# Patient Record
Sex: Male | Born: 1935 | Hispanic: No | Smoking: Former smoker
Health system: Southern US, Community
[De-identification: ages and names within clinical notes are randomized; demographics above are authoritative.]

## PROBLEM LIST (undated history)

## (undated) DIAGNOSIS — R634 Abnormal weight loss: Secondary | ICD-10-CM

## (undated) DIAGNOSIS — I739 Peripheral vascular disease, unspecified: Secondary | ICD-10-CM

## (undated) DIAGNOSIS — E440 Moderate protein-calorie malnutrition: Secondary | ICD-10-CM

## (undated) DIAGNOSIS — I10 Essential (primary) hypertension: Secondary | ICD-10-CM

## (undated) DIAGNOSIS — I779 Disorder of arteries and arterioles, unspecified: Secondary | ICD-10-CM

## (undated) HISTORY — PX: CATARACT EXTRACTION: SUR2

## (undated) HISTORY — DX: Moderate protein-calorie malnutrition: E44.0

## (undated) HISTORY — PX: HEMORRHOID SURGERY: SHX153

## (undated) HISTORY — DX: Essential (primary) hypertension: I10

## (undated) HISTORY — DX: Peripheral vascular disease, unspecified: I73.9

## (undated) HISTORY — DX: Abnormal weight loss: R63.4

## (undated) HISTORY — DX: Disorder of arteries and arterioles, unspecified: I77.9

## (undated) HISTORY — PX: PROSTATE BIOPSY: SHX241

## (undated) HISTORY — PX: HERNIA REPAIR: SHX51

---

## 2000-06-27 ENCOUNTER — Other Ambulatory Visit: Admission: RE | Admit: 2000-06-27 | Discharge: 2000-06-27 | Payer: Self-pay | Admitting: Urology

## 2014-06-12 ENCOUNTER — Encounter (HOSPITAL_COMMUNITY): Payer: Self-pay | Admitting: *Deleted

## 2014-06-12 ENCOUNTER — Ambulatory Visit (INDEPENDENT_AMBULATORY_CARE_PROVIDER_SITE_OTHER): Payer: Medicare Other | Admitting: Neurology

## 2014-06-12 ENCOUNTER — Encounter: Payer: Self-pay | Admitting: Neurology

## 2014-06-12 ENCOUNTER — Encounter: Payer: Self-pay | Admitting: *Deleted

## 2014-06-12 ENCOUNTER — Inpatient Hospital Stay (HOSPITAL_COMMUNITY): Payer: Medicare Other

## 2014-06-12 ENCOUNTER — Inpatient Hospital Stay (HOSPITAL_COMMUNITY)
Admission: AD | Admit: 2014-06-12 | Discharge: 2014-06-18 | DRG: 056 | Payer: Medicare Other | Source: Ambulatory Visit | Attending: Internal Medicine | Admitting: Internal Medicine

## 2014-06-12 VITALS — BP 151/78 | HR 97 | Ht 69.0 in | Wt 131.0 lb

## 2014-06-12 DIAGNOSIS — R296 Repeated falls: Secondary | ICD-10-CM | POA: Diagnosis present

## 2014-06-12 DIAGNOSIS — G7000 Myasthenia gravis without (acute) exacerbation: Secondary | ICD-10-CM

## 2014-06-12 DIAGNOSIS — N400 Enlarged prostate without lower urinary tract symptoms: Secondary | ICD-10-CM | POA: Diagnosis present

## 2014-06-12 DIAGNOSIS — K5900 Constipation, unspecified: Secondary | ICD-10-CM | POA: Diagnosis present

## 2014-06-12 DIAGNOSIS — I10 Essential (primary) hypertension: Secondary | ICD-10-CM | POA: Diagnosis not present

## 2014-06-12 DIAGNOSIS — R1310 Dysphagia, unspecified: Secondary | ICD-10-CM | POA: Diagnosis present

## 2014-06-12 DIAGNOSIS — K219 Gastro-esophageal reflux disease without esophagitis: Secondary | ICD-10-CM | POA: Diagnosis present

## 2014-06-12 DIAGNOSIS — E43 Unspecified severe protein-calorie malnutrition: Secondary | ICD-10-CM | POA: Diagnosis present

## 2014-06-12 DIAGNOSIS — F329 Major depressive disorder, single episode, unspecified: Secondary | ICD-10-CM | POA: Diagnosis present

## 2014-06-12 DIAGNOSIS — Z87891 Personal history of nicotine dependence: Secondary | ICD-10-CM

## 2014-06-12 DIAGNOSIS — E860 Dehydration: Secondary | ICD-10-CM | POA: Diagnosis present

## 2014-06-12 DIAGNOSIS — Z820 Family history of epilepsy and other diseases of the nervous system: Secondary | ICD-10-CM | POA: Diagnosis not present

## 2014-06-12 DIAGNOSIS — H02433 Paralytic ptosis of bilateral eyelids: Secondary | ICD-10-CM | POA: Diagnosis present

## 2014-06-12 DIAGNOSIS — R29898 Other symptoms and signs involving the musculoskeletal system: Secondary | ICD-10-CM

## 2014-06-12 DIAGNOSIS — Z681 Body mass index (BMI) 19 or less, adult: Secondary | ICD-10-CM | POA: Diagnosis not present

## 2014-06-12 DIAGNOSIS — Z823 Family history of stroke: Secondary | ICD-10-CM | POA: Diagnosis not present

## 2014-06-12 DIAGNOSIS — R531 Weakness: Secondary | ICD-10-CM | POA: Diagnosis present

## 2014-06-12 DIAGNOSIS — Z8249 Family history of ischemic heart disease and other diseases of the circulatory system: Secondary | ICD-10-CM

## 2014-06-12 DIAGNOSIS — Z452 Encounter for adjustment and management of vascular access device: Secondary | ICD-10-CM | POA: Insufficient documentation

## 2014-06-12 DIAGNOSIS — T17908A Unspecified foreign body in respiratory tract, part unspecified causing other injury, initial encounter: Secondary | ICD-10-CM | POA: Insufficient documentation

## 2014-06-12 LAB — URINALYSIS, ROUTINE W REFLEX MICROSCOPIC
Glucose, UA: NEGATIVE mg/dL
HGB URINE DIPSTICK: NEGATIVE
Ketones, ur: 15 mg/dL — AB
Leukocytes, UA: NEGATIVE
NITRITE: NEGATIVE
Protein, ur: NEGATIVE mg/dL
Specific Gravity, Urine: 1.021 (ref 1.005–1.030)
Urobilinogen, UA: 0.2 mg/dL (ref 0.0–1.0)
pH: 5 (ref 5.0–8.0)

## 2014-06-12 LAB — COMPREHENSIVE METABOLIC PANEL
ALBUMIN: 2.5 g/dL — AB (ref 3.5–5.2)
ALK PHOS: 36 U/L — AB (ref 39–117)
ALT: 75 U/L — ABNORMAL HIGH (ref 0–53)
ANION GAP: 9 (ref 5–15)
AST: 113 U/L — AB (ref 0–37)
BILIRUBIN TOTAL: 0.7 mg/dL (ref 0.3–1.2)
BUN: 33 mg/dL — AB (ref 6–23)
CHLORIDE: 105 mmol/L (ref 96–112)
CO2: 25 mmol/L (ref 19–32)
Calcium: 9.8 mg/dL (ref 8.4–10.5)
Creatinine, Ser: 1.49 mg/dL — ABNORMAL HIGH (ref 0.50–1.35)
GFR calc Af Amer: 48 mL/min — ABNORMAL LOW (ref >90)
GFR calc non Af Amer: 42 mL/min — ABNORMAL LOW (ref >90)
Glucose, Bld: 93 mg/dL (ref 70–99)
Potassium: 4 mmol/L (ref 3.5–5.1)
Sodium: 139 mmol/L (ref 135–145)
Total Protein: 6.2 g/dL (ref 6.0–8.3)

## 2014-06-12 LAB — CBC
HEMATOCRIT: 37.6 % — AB (ref 39.0–52.0)
HEMOGLOBIN: 11.8 g/dL — AB (ref 13.0–17.0)
MCH: 29.4 pg (ref 26.0–34.0)
MCHC: 31.4 g/dL (ref 30.0–36.0)
MCV: 93.8 fL (ref 78.0–100.0)
Platelets: 141 10*3/uL — ABNORMAL LOW (ref 150–400)
RBC: 4.01 MIL/uL — AB (ref 4.22–5.81)
RDW: 16.1 % — ABNORMAL HIGH (ref 11.5–15.5)
WBC: 4.4 10*3/uL (ref 4.0–10.5)

## 2014-06-12 LAB — PROTIME-INR
INR: 1.17 (ref 0.00–1.49)
Prothrombin Time: 15 seconds (ref 11.6–15.2)

## 2014-06-12 LAB — TSH: TSH: 3.914 u[IU]/mL (ref 0.350–4.500)

## 2014-06-12 MED ORDER — ACETAMINOPHEN 325 MG PO TABS: 650.0000 mg | ORAL_TABLET | Freq: Four times a day (QID) | ORAL | Status: DC | PRN

## 2014-06-12 MED ORDER — ASPIRIN 325 MG PO TABS: 325.0000 mg | ORAL_TABLET | Freq: Every day | ORAL | Status: DC

## 2014-06-12 MED ORDER — ALUM & MAG HYDROXIDE-SIMETH 200-200-20 MG/5ML PO SUSP: 30.0000 mL | Freq: Four times a day (QID) | ORAL | Status: DC | PRN

## 2014-06-12 MED ORDER — OXYCODONE HCL 5 MG PO TABS
5.0000 mg | ORAL_TABLET | ORAL | Status: DC | PRN
  Administered 2014-06-14 – 2014-06-17 (×7): 5 mg via ORAL
  Filled 2014-06-12 (×7): qty 1

## 2014-06-12 MED ORDER — SODIUM CHLORIDE 0.9 % IV SOLN
INTRAVENOUS | Status: DC
  Administered 2014-06-12 – 2014-06-15 (×6): via INTRAVENOUS

## 2014-06-12 MED ORDER — PYRIDOSTIGMINE BROMIDE 60 MG PO TABS
60.0000 mg | ORAL_TABLET | Freq: Three times a day (TID) | ORAL | Status: DC
  Administered 2014-06-12 – 2014-06-18 (×18): 60 mg via ORAL
  Filled 2014-06-12 (×21): qty 1

## 2014-06-12 MED ORDER — HEPARIN SODIUM (PORCINE) 5000 UNIT/ML IJ SOLN
5000.0000 [IU] | Freq: Three times a day (TID) | INTRAMUSCULAR | Status: DC
  Administered 2014-06-12 – 2014-06-18 (×18): 5000 [IU] via SUBCUTANEOUS
  Filled 2014-06-12 (×20): qty 1

## 2014-06-12 MED ORDER — SODIUM CHLORIDE 0.9 % IV BOLUS (SEPSIS): 1000.0000 mL | Freq: Once | INTRAVENOUS | Status: DC

## 2014-06-12 MED ORDER — ONDANSETRON HCL 4 MG PO TABS: 4.0000 mg | ORAL_TABLET | Freq: Four times a day (QID) | ORAL | Status: DC | PRN

## 2014-06-12 MED ORDER — ENSURE COMPLETE PO LIQD
237.0000 mL | Freq: Two times a day (BID) | ORAL | Status: DC
  Administered 2014-06-12 – 2014-06-18 (×11): 237 mL via ORAL
  Filled 2014-06-12: qty 237

## 2014-06-12 MED ORDER — ACETAMINOPHEN 650 MG RE SUPP: 650.0000 mg | Freq: Four times a day (QID) | RECTAL | Status: DC | PRN

## 2014-06-12 MED ORDER — IMMUNE GLOBULIN (HUMAN) 5 GM/100ML IV SOLN
400.0000 mg/kg | INTRAVENOUS | Status: DC
  Administered 2014-06-12 – 2014-06-15 (×4): 25 g via INTRAVENOUS
  Filled 2014-06-12 (×7): qty 100

## 2014-06-12 MED ORDER — BENAZEPRIL HCL 20 MG PO TABS
20.0000 mg | ORAL_TABLET | Freq: Every day | ORAL | Status: DC
  Administered 2014-06-12 – 2014-06-18 (×7): 20 mg via ORAL
  Filled 2014-06-12 (×7): qty 1

## 2014-06-12 MED ORDER — SODIUM CHLORIDE 0.9 % IJ SOLN
3.0000 mL | Freq: Two times a day (BID) | INTRAMUSCULAR | Status: DC
  Administered 2014-06-15 – 2014-06-18 (×6): 3 mL via INTRAVENOUS

## 2014-06-12 MED ORDER — FINASTERIDE 5 MG PO TABS
5.0000 mg | ORAL_TABLET | Freq: Every day | ORAL | Status: DC
  Administered 2014-06-12 – 2014-06-18 (×7): 5 mg via ORAL
  Filled 2014-06-12 (×7): qty 1

## 2014-06-12 MED ORDER — SODIUM CHLORIDE 0.9 % IV BOLUS (SEPSIS): 500.0000 mL | Freq: Once | INTRAVENOUS | Status: DC

## 2014-06-12 MED ORDER — ONDANSETRON HCL 4 MG/2ML IJ SOLN
4.0000 mg | Freq: Four times a day (QID) | INTRAMUSCULAR | Status: DC | PRN
  Administered 2014-06-15: 4 mg via INTRAVENOUS
  Filled 2014-06-12: qty 2

## 2014-06-12 NOTE — Evaluation (Signed)
Physical Therapy Evaluation Patient Details Name: [name] MRN: [id number] DOB: [birth year] Today's Date: 06/12/2014   History of Present Illness  [name] is a 81 y.o. male with a past medical history of hypertension, gastroesophageal reflux disease, presenting as a transfer from his neurologist office. Patient having progressive generalized weakness that started 6 months to 1 year ago, that has been associated with diplopia, dysphasia, and bilateral ptosis. Patient states that symptoms occurring throughout the day however on occasion have been worse at nighttime. He reports having multiple falls in in the past with his last fall being one month ago. He also reports a 20 pound weight loss throughout this period, attributing this to minimal by mouth intake stating he cannot have solid foods. He was evaluated by his neurologist today felt that symptoms likely secondary to myasthenia gravis for which he was referred for further workup and initiation of therapy  Clinical Impression  Pt admitted with/for progressive weakness of months up to a year. Working dx thought to be due to Myasthenia Gravis..  Pt currently limited functionally due to the problems listed. ( See problems list.)   Pt will benefit from PT to maximize function and safety in order to get ready for next venue listed below.     Follow Up Recommendations CIR    Equipment Recommendations  Other (comment) (TBA)    Recommendations for Other Services Rehab consult     Precautions / Restrictions Precautions Precautions: Fall      Mobility  Bed Mobility Overal bed mobility: Needs Assistance Bed Mobility: Rolling;Sidelying to Sit Rolling: Min assist Sidelying to sit: Min assist       General bed mobility comments: truncal assist  Transfers Overall transfer level: Needs assistance   Transfers: Sit to/from Stand;Stand Pivot Transfers Sit to Stand: Min guard Stand pivot transfers: Min guard       General  transfer comment: cues for hand placement and safety.  Accomplished the transfer relativiely eacy with arm rest to push up on.  Ambulation/Gait Ambulation/Gait assistance: Min guard Ambulation Distance (Feet): 80 Feet Assistive device: Rolling walker (2 wheeled) Gait Pattern/deviations: Step-through pattern;Decreased step length - right;Decreased step length - left (decreased knee flexion at toe off.) Gait velocity: slower Gait velocity interpretation: Below normal speed for age/gender General Gait Details: mildly weak kneed with decr knee flexion at toe off.  Stairs            Wheelchair Mobility    Modified Rankin (Stroke Patients Only)       Balance Overall balance assessment: Needs assistance Sitting-balance support: No upper extremity supported Sitting balance-Leahy Scale: Fair     Standing balance support: No upper extremity supported Standing balance-Leahy Scale: Fair                               Pertinent Vitals/Pain Pain Assessment: Faces Faces Pain Scale: No hurt    Home Living Family/patient expects to be discharged to:: Private residence Living Arrangements: Alone Available Help at Discharge: Family;Available 24 hours/day Type of Home: House Home Access: Level entry     Home Layout: One level;Other (Comment) (but 1 room is raised/sunk in by 1 step level) Home Equipment: None      Prior Function Level of Independence: Independent (but used available furnitue and structures)               Hand Dominance        Extremity/Trunk Assessment  Upper Extremity Assessment: Generalized weakness;Overall [facility]FL for tasks assessed (grip and biceps/triceps weak)           Lower Extremity Assessment: Generalized weakness;RLE deficits/detail;LLE deficits/detail RLE Deficits / Details: hip flexors 3/5, quads 3+/5, hams 3/5, df/pr 4/5 LLE Deficits / Details: hip flexors 3/5, quads 3+/5, hams 3/5, df/pf 4/5,  Cervical / Trunk  Assessment:  (truncal weakness)  Communication   Communication: No difficulties  Cognition Arousal/Alertness: Awake/alert Behavior During Therapy: WFL for tasks assessed/performed Overall Cognitive Status: Within Functional Limits for tasks assessed                      General Comments      Exercises        Assessment/Plan    PT Assessment Patient needs continued PT services  PT Diagnosis Abnormality of gait;Generalized weakness   PT Problem List Decreased strength;Decreased activity tolerance;Decreased balance;Decreased mobility;Decreased knowledge of use of DME;Decreased safety awareness  PT Treatment Interventions Gait training;DME instruction;Stair training;Functional mobility training;Therapeutic activities;Therapeutic exercise;Neuromuscular re-education;Patient/family education;Balance training   PT Goals (Current goals can be found in the Care Plan section) Acute Rehab PT Goals Patient Stated Goal: Be safe independent at home. PT Goal Formulation: With patient Time For Goal Achievement: 06/26/14 Potential to Achieve Goals: Good    Frequency Min 3X/week   Barriers to discharge        Co-evaluation               End of Session   Activity Tolerance: Patient tolerated treatment well Patient left: in bed;with call bell/phone within reach Nurse Communication: Mobility status         Time: [id number] PT Time Calculation (min) (ACUTE ONLY): 31 min   Charges:   PT Evaluation $Initial PT Evaluation Tier I: 1 Procedure PT Treatments $Gait Training: 8-22 mins   PT G Codes:        Mckinnon Glick, Eliseo GumKenneth V 06/12/2014, 4:12 PM 06/12/2014  [place] BingKen Janira Mandell, PT [phone]  (pager)

## 2014-06-12 NOTE — H&P (Signed)
[facility] History and Physical  Ronald AprilBilly W Kuchera GEX:[id number] DOB: 02-20-1929 DOA: 06/12/2014  Referring physician:  PCP: Marylynn PearsonBurkhart,Brian, MD   Chief Complaint: Generalized Weakness  HPI: [name] is a 81 y.o. male with a past medical history of hypertension, gastroesophageal reflux disease, presenting as a transfer from his neurologist office. Patient having progressive generalized weakness that started 6 months to 1 year ago, that has been associated with diplopia, dysphasia, and bilateral ptosis. Patient states that symptoms occurring throughout the day however on occasion have been worse at nighttime. He reports having multiple falls in in the past with his last fall being one month ago. He also reports a 20 pound weight loss throughout this period, attributing this to minimal by mouth intake stating he cannot have solid foods. He was evaluated by his neurologist today felt that symptoms likely secondary to myasthenia gravis for which he was referred for further workup and initiation of therapy. Patient denies shortness of breath and it did not have evidence of airway compromise during my evaluation. At baseline he is highly functional, independent on all activities of daily living. Lately due to generalized weakness he has required increased assistance.                                                                                                                                                                                                                               Review of Systems:  Constitutional:  No weight loss, night sweats, Fevers, chills, positive for fatigue.  HEENT:  No headaches, Difficulty swallowing,Tooth/dental problems,Sore throat,  No sneezing, itching, ear ache, nasal congestion, post nasal drip,  Cardio-vascular:  No chest pain, Orthopnea, PND, swelling in lower extremities, anasarca, dizziness, palpitations  GI:  No heartburn, indigestion, abdominal  pain, nausea, vomiting, diarrhea, change in bowel habits, loss of appetite  Resp:  No shortness of breath with exertion or at rest. No excess mucus, no productive cough, No non-productive cough, No coughing up of blood.No change in color of mucus.No wheezing.No chest wall deformity  Skin:  no rash or lesions.  GU:  no dysuria, change in color of urine, no urgency or frequency. No flank pain.  Musculoskeletal:  Positive for generalized muscle weakness, diplopia, dysphasia  Psych:  No change in mood or affect. No depression or anxiety. No memory loss.   Past Medical History  Diagnosis Date  . Hypertension   . Carotid artery disease   . Weight  loss, unintentional   . Protein-calorie malnutrition, moderate    Past Surgical History  Procedure Laterality Date  . Hernia repair      Bilateral  . Prostate biopsy      WNL  . Hemorrhoid surgery    . Cataract extraction     Social History:  reports that he quit smoking about 44 years ago. He does not have any smokeless tobacco history on file. He reports that he does not drink alcohol or use illicit drugs.  Allergies  Allergen Reactions  . Sulfa Antibiotics Itching    Family History  Problem Relation Age of Onset  . Parkinson's disease Brother     Deceased - 62  . Heart disease Sister     Deceased - 44  . Heart attack Brother     Deceased - 59  . Stroke Brother   . Heart disease Mother     Prior to Admission medications   Medication Sig Start Date End Date Taking? Authorizing Provider  aspirin 325 MG tablet Take 325 mg by mouth daily.   Yes Historical Provider, MD  benazepril (LOTENSIN) 20 MG tablet Take 20 mg by mouth daily.   Yes Historical Provider, MD  finasteride (PROSCAR) 5 MG tablet Take 5 mg by mouth daily.   Yes Historical Provider, MD  mirtazapine (REMERON) 15 MG tablet Take 15 mg by mouth at bedtime.   Yes Historical Provider, MD  omeprazole (PRILOSEC) 20 MG capsule Take 20 mg by mouth 2 (two) times daily.   Yes  Historical Provider, MD   Physical Exam: Filed Vitals:   06/12/14 1223  BP: 131/71  Pulse: 81  Resp: 20  Height:  (1.753 m)  Weight: 59 kg (130 lb 1.1 oz)  SpO2: 98%    Wt Readings from Last 3 Encounters:  06/12/14 59 kg (130 lb 1.1 oz)  06/12/14 59.421 kg (131 lb)    General:  Ill-appearing, no acute distress. Has bilateral ptosis, weak speech, however is awake and alert. Eyes: PERRL, normal lids, irises & conjunctiva ENT: grossly normal hearing, lips & tongue Neck: no LAD, masses or thyromegaly Cardiovascular: RRR, no m/r/g. No LE edema. Telemetry: SR, no arrhythmias  Respiratory: CTA bilaterally, no w/r/r. Normal respiratory effort. Abdomen: soft, ntnd Skin: no rash or induration seen on limited exam Musculoskeletal: grossly normal tone BUE/BLE Psychiatric: grossly normal mood and affect, speech fluent and appropriate Neurologic: Patient having generalized weakness, weak speech, bilateral ptosis. Reports diplopia. Did not have focal neurological deficits on my exam.           Labs on Admission:  Basic Metabolic Panel: No results for input(s): NA, K, CL, CO2, GLUCOSE, BUN, CREATININE, CALCIUM, MG, PHOS in the last 168 hours. Liver Function Tests: No results for input(s): AST, ALT, ALKPHOS, BILITOT, PROT, ALBUMIN in the last 168 hours. No results for input(s): LIPASE, AMYLASE in the last 168 hours. No results for input(s): AMMONIA in the last 168 hours. CBC: No results for input(s): WBC, NEUTROABS, HGB, HCT, MCV, PLT in the last 168 hours. Cardiac Enzymes: No results for input(s): CKTOTAL, CKMB, CKMBINDEX, TROPONINI in the last 168 hours.  BNP (last 3 results) No results for input(s): BNP in the last 8760 hours.  ProBNP (last 3 results) No results for input(s): PROBNP in the last 8760 hours.  CBG: No results for input(s): GLUCAP in the last 168 hours.  Radiological Exams on Admission: No results found.  EKG: Independently reviewed.    Assessment/Plan Active Problems:   Myasthenia  gravis   Weakness of both legs   GERD (gastroesophageal reflux disease)   HTN (hypertension)   1. Probable myasthenia gravis. Patient presenting with clinical signs and symptoms highly suggestive of myasthenia gravis. He has had progressive weakness over the past 6 - 12 months including diplopia, ptosis, dysphasia. Furthermore he has had significant weight loss over the past several months secondary to poor by mouth intake. Case was discussed with neurology. Will obtain cholinesterase receptor antibodies and MRI of the brain. Neurology will likely start Mestinon along with IVIG. Will monitor closely for evidence of respiratory compromise. Consult speech pathology, meanwhile continue dysphagia 1 diet. Physical therapy and occupational therapy consultations to have also been placed. 2. Probable dehydration. Patient appears dehydrated on physical exam likely due to minimal by mouth intake from trouble swallowing secondary to myasthenia gravis. Will obtain a comprehensive metabolic panel meanwhile initiate IV fluid resuscitation starting with a 1000 mL bolus of NS followed by maintenance fluids at 75 mL/hour.  3. Essential hypertension. Blood pressure stable, patient having initial blood pressure 131/71. Will continue his home regimen with benazepril 20 mg by mouth daily. Will check a CMP now to assess kidney functon.  4. Severe protein calorie malnutrition. Secondary to poor PO intake from myasthenia. Will provide protein supplementation and treat underlying neurological cause.  5. GERD. Continue PPI therapy 6. DVT prophylaxis. [place] Heparin    Code Status: Full Family Communication: Spoke with his son and daughter at bedside Disposition Plan: Anticipate he will require greater than 2 nights hospitalization  Time spent: 65 min  Jeralyn Bennett [facility] Pager [phone]

## 2014-06-12 NOTE — Progress Notes (Signed)
Received prescreen request for inpatient rehab from PT and have reviewed pt's case. I noted that pt is performing functional mobility and gait at a min guard level. I would like to see how pt does with OT. I anticipate that pt may be too high level for the intensity of inpatient rehab and that pt will continue to make good progress.  I will check on pt's status tomorrow and make a recommendation then.  Thanks.  Juliann MuleJanine Giavanna Kang, PT Rehabilitation Admissions Coordinator [phone]

## 2014-06-12 NOTE — Progress Notes (Signed)
Pt not in room for VC/NIF. Will attempt later.

## 2014-06-12 NOTE — Consult Note (Signed)
NEURO HOSPITALIST CONSULT NOTE    Reason for Consult: Myasthenia Gravis  HPI:                                                                                                                                          [name] is an 81 y.o. male brought to hospital for further evaluation for myasthenia gravis. Patient was seen by Dr. Terrace Arabia (neurology) due to PCP referring to neurology for gradual onset generalized weakness (which started one year ago but has become significantly worse since November), intermittent binocular double vision--especially when looking in the distance, intermittent droopy eyelid, also with swallowing difficulty, weak cough, with symptoms worst is at evening time. Over the past few months, he has lost more than 20 pounds, gait difficulty, poor appetite, could no longer eat solid food, generalized weakness. He states he does not feel fatigued from chewing but has no appetite and just stops eating.  He states he has had no SOB but does feel winded when walking.  Patient was seen by Dr. Terrace Arabia today and she is concerned this represents a myasthenia exacerbation and needed hospitalization for Iv[place] and initiation of mestinon.   CT angiogram of the chest August 25th 2015, no pulmonary emboli, no mediastinal hematoma, or adenopathy, trace pericardial effusion, borderline bilateral healing for nose, no acute pulmonary edema, stable compression deformity of T8 vertebral body,  Arterial Doppler study of left upper extremity December 13 2013, normal left upper extremity arterial Doppler ultrasound. Left upper extremity venous Doppler ultrasound in August 2015, positive for left brachial vein DVT centered at the level of anterior cubital fossa.  Past Medical History  Diagnosis Date  . Hypertension   . Carotid artery disease   . Weight loss, unintentional   . Protein-calorie malnutrition, moderate     Past Surgical History  Procedure Laterality Date  . Hernia  repair      Bilateral  . Prostate biopsy      WNL  . Hemorrhoid surgery    . Cataract extraction      Family History  Problem Relation Age of Onset  . Parkinson's disease Brother     Deceased - 70  . Heart disease Sister     Deceased - 22  . Heart attack Brother     Deceased - 4  . Stroke Brother   . Heart disease Mother      Social History:  reports that he quit smoking about 44 years ago. He does not have any smokeless tobacco history on file. He reports that he does not drink alcohol or use illicit drugs.  Allergies  Allergen Reactions  . Sulfa Antibiotics Itching    MEDICATIONS:  Prior to Admission:  Prescriptions prior to admission  Medication Sig Dispense Refill Last Dose  . aspirin 325 MG tablet Take 325 mg by mouth daily.   06/11/2014 at 0800  . benazepril (LOTENSIN) 20 MG tablet Take 20 mg by mouth daily.   06/11/2014 at 0800  . finasteride (PROSCAR) 5 MG tablet Take 5 mg by mouth daily.   06/11/2014 at 0800  . mirtazapine (REMERON) 15 MG tablet Take 15 mg by mouth at bedtime.   06/11/2014 at hs  . omeprazole (PRILOSEC) 20 MG capsule Take 20 mg by mouth 2 (two) times daily.   06/11/2014 at 0800   Scheduled: . aspirin  325 mg Oral Daily  . benazepril  20 mg Oral Daily  . feeding supplement (ENSURE COMPLETE)  237 mL Oral BID BM  . finasteride  5 mg Oral Daily  . heparin  5,000 Units Subcutaneous 3 times per day  . Immune Globulin 5%  400 mg/kg Intravenous Q24 Hr x 5  . pyridostigmine  60 mg Oral 3 times per day  . sodium chloride  3 mL Intravenous Q12H     ROS:                                                                                                                                       History obtained from the patient  General ROS: negative for - chills, fatigue, fever, night sweats, weight gain or weight loss Psychological ROS: negative  for - behavioral disorder, hallucinations, memory difficulties, mood swings or suicidal ideation Ophthalmic ROS: negative for - blurry vision, double vision, eye pain or loss of vision ENT ROS: negative for - epistaxis, nasal discharge, oral lesions, sore throat, tinnitus or vertigo Allergy and Immunology ROS: negative for - hives or itchy/watery eyes Hematological and Lymphatic ROS: negative for - bleeding problems, bruising or swollen lymph nodes Endocrine ROS: negative for - galactorrhea, hair pattern changes, polydipsia/polyuria or temperature intolerance Respiratory ROS: negative for - cough, hemoptysis, shortness of breath or wheezing Cardiovascular ROS: negative for - chest pain, dyspnea on exertion, edema or irregular heartbeat Gastrointestinal ROS: negative for - abdominal pain, diarrhea, hematemesis, nausea/vomiting or stool incontinence Genito-Urinary ROS: negative for - dysuria, hematuria, incontinence or urinary frequency/urgency Musculoskeletal ROS: negative for - joint swelling or muscular weakness Neurological ROS: as noted in HPI Dermatological ROS: negative for rash and skin lesion changes   Blood pressure 131/71, pulse 81, resp. rate 20, height 5\' 9"  (1.753 m), weight 59 kg (130 lb 1.1 oz), SpO2 98 %.   Examination:  HEENT-  Normocephalic, no lesions, without obvious abnormality.  Normal external eye and conjunctiva.  Normal TM's bilaterally.  Normal auditory canals and external ears. Normal external nose, mucus membranes and septum.  Normal pharynx. Cardiovascular- S1, S2 normal, pulses palpable throughout   Lungs- chest clear, no wheezing, rales, normal symmetric air entry Abdomen- normal findings: bowel sounds normal Extremities- no edema Lymph-no adenopathy palpable Musculoskeletal-no joint tenderness, deformity or swelling Skin-warm and dry, no hyperpigmentation,  vitiligo, or suspicious lesions  Neurological Examination Mental Status: Alert, oriented, thought content appropriate.  Speech fluent but with prolonged talking he becomes mildly dysarthric without evidence of aphasia.  Able to follow 3 step commands without difficulty. Cranial Nerves: II: Discs flat bilaterally; Visual fields grossly normal, pupils equal, round, reactive to light and accommodation III,IV, VI: ptosis present in the left eye and worsens with prolonged vertical gaze, extra-ocular motions intact bilaterally V,VII: smile symmetric, facial light touch sensation normal bilaterally VIII: hearing normal bilaterally IX,X: gag reflex present XI: bilateral shoulder shrug XII: midline tongue extension --strong head extension strength with mild weakness noted with neck flexion.  No weakness noted in jaw muscles Motor: Right : Upper extremity   5/5    Left:     Upper extremity   5/5  Lower extremity   5/5     Lower extremity   5/5 --noted fatigability with rapid arm pumping maneuver from 5/5 to a 4-/5 strength.  Tone and bulk:normal tone throughout; no atrophy noted Sensory: Pinprick and light touch intact throughout, bilaterally Deep Tendon Reflexes: 2+ and symmetric throughout with no AJ Plantars: Right: downgoing   Left: downgoing Cerebellar: normal finger-to-nose, and normal heel-to-shin test Gait: not tested due to safety.       Lab Results: Basic Metabolic Panel: No results for input(s): NA, K, CL, CO2, GLUCOSE, BUN, CREATININE, CALCIUM, MG, PHOS in the last 168 hours.  Liver Function Tests: No results for input(s): AST, ALT, ALKPHOS, BILITOT, PROT, ALBUMIN in the last 168 hours. No results for input(s): LIPASE, AMYLASE in the last 168 hours. No results for input(s): AMMONIA in the last 168 hours.  CBC: No results for input(s): WBC, NEUTROABS, HGB, HCT, MCV, PLT in the last 168 hours.  Cardiac Enzymes: No results for input(s): CKTOTAL, CKMB, CKMBINDEX, TROPONINI in  the last 168 hours.  Lipid Panel: No results for input(s): CHOL, TRIG, HDL, CHOLHDL, VLDL, LDLCALC in the last 168 hours.  CBG: No results for input(s): GLUCAP in the last 168 hours.  Microbiology: No results found for this or any previous visit.  Coagulation Studies: No results for input(s): LABPROT, INR in the last 72 hours.  Imaging: No results found.   Assessment and plan per attending neurologist  Felicie Morn PA-C [facility] [phone]  06/12/2014, 1:03 PM   Assessment/Plan: 81 YO male presenting with 1 year history of increased fatigue and decreased appetite now with worsening fatigue, intermittent diplopia, intermittent ptosis and swallowing difficulty which is worse in the evenings. Concern is for generalized myasthenia gravis.    Recommend: 1) MRI brain  2) Acetylcholine antibodies and myasthenia gravis panel 3) IVIG daily for total 5 doses 4) Daily NIF and VC 5) SLP to evaluate for swallowing and diet 6) Mestinon 60 mg TID   I personally participated in this patient's evaluation and management, including formulating the above clinical impression and management recommendations.  Venetia Maxon M.D. [facility] [phone]

## 2014-06-12 NOTE — Progress Notes (Signed)
PATIENT: [name] DOB: 07/29/1928  HISTORICAL  [name] is a 81 yo RH is referred by his primary care physician Dr. Elton SinBurkhart for evaluation of weakness he is accompanied by his daughter, and son at today's clinical visit.  He had a history of hypertension, highly functional, lives alone, independent until spring of 2015, he began to notice gradual onset generalized weakness, intermittent binocular double vision, intermittent droopy eyelid, also with swallowing difficulty, weak cough, his symptoms varies, worst is at evening time,  He denies sensory loss, no significant pain, no bowel and bladder incontinence.  Over the past few months, he has lost more than 20 pounds, gait difficulty, poor appetite, could no longer eat solid food, generalized weakness.  CT angiogram of the chest August 25th 2015, no pulmonary emboli, no mediastinal hematoma, or adenopathy, trace pericardial effusion, borderline bilateral healing for nose, no acute pulmonary edema, stable compression deformity of T8 vertebral body,  Arterial Doppler study of left upper extremity December 13 2013, normal left upper extremity arterial Doppler ultrasound. Left upper extremity venous Doppler ultrasound in August 2015, positive for left brachial vein DVT centered at the level of anterior cubital fossa.  Laboratory August 2015, protein C was low 61, normal protein C function, mild low antithrombin activity 69, normal CBC, BMP, liver function tests, mild elevated LDL 133  REVIEW OF SYSTEMS: Full 14 system review of systems performed and notable only for weight loss, fatigue, double vision, easy bruising, weakness, difficulty swallowing, depression, not enough sleep, change in appetite, trouble swallowing.   ALLERGIES: Not on File  HOME MEDICATIONS: Current Outpatient Prescriptions  Medication Sig Dispense Refill  . aspirin 325 MG tablet Take 325 mg by mouth daily.    . benazepril (LOTENSIN) 20 MG tablet Take 20  mg by mouth daily.    . finasteride (PROSCAR) 5 MG tablet Take 5 mg by mouth daily.    . mirtazapine (REMERON) 15 MG tablet Take 15 mg by mouth at bedtime.    Marland Kitchen. omeprazole (PRILOSEC) 20 MG capsule Take 20 mg by mouth 2 (two) times daily.     No current facility-administered medications for this visit.    PAST MEDICAL HISTORY: Past Medical History  Diagnosis Date  . Hypertension   . Carotid artery disease   . Weight loss, unintentional   . Protein-calorie malnutrition, moderate     PAST SURGICAL HISTORY: Past Surgical History  Procedure Laterality Date  . Hernia repair      Bilateral  . Prostate biopsy      WNL  . Hemorrhoid surgery    . Cataract extraction      FAMILY HISTORY: Family History  Problem Relation Age of Onset  . Parkinson's disease Brother     Deceased - 2774  . Heart disease Sister     Deceased - 7682  . Heart attack Brother     Deceased - 7478  . Stroke Brother   . Heart disease Mother     SOCIAL HISTORY:  History   Social History  . Marital Status: Unknown    Spouse Name: N/A  . Number of Children: N/A  . Years of Education: N/A   Occupational History  . Not on file.   Social History Main Topics  . Smoking status: Former Games developermoker  . Smokeless tobacco: Not on file  . Alcohol Use: No  . Drug Use: No  . Sexual Activity: Not on file   Other Topics Concern  . Not on file  Social History Narrative   PHYSICAL EXAM   Filed Vitals:   06/12/14 0905  BP: 151/78  Pulse: 97  Height:  (1.753 m)  Weight: 131 lb (59.421 kg)    Not recorded      Body mass index is 19.34 kg/(m^2).   Generalized: In no acute distress  Neck: Supple, no carotid bruits   Cardiac: Regular rate rhythm  Pulmonary: Clear to auscultation bilaterally  Musculoskeletal: No deformity  Neurological examination  Mentation: fragile elderly gentleman, weak cough, mild slurred speech,  alert oriented to time, place, history taking, and causual  conversation  Cranial nerve II-XII: Pupils were equal round reactive to light. Extraocular movements were full. Red lens testing confirmed bilateral lateral rectus weakness.  Visual field were full on confrontational test. Bilateral fundi were sharp.  Facial sensation was normal. Fatigable bilateral ptosis, left worse than right, moderate eye closure weakness. Marland Kitchen Hearing was intact to finger rubbing bilaterally. Uvula tongue midline. Moderate neck flexion weakness, mild to moderate soft palate elevation and tongue protrusion weakness.  Motor: Moderate neck flexion weakness, moderate bilateral shoulder abduction, external rotation, bilateral hip flexion weakness. Moderate bilateral ankle dorsiflexion weakness, right worse than left.   Sensory: Intact to fine touch, pinprick, preserved vibratory sensation at the ankle   Coordination: Normal finger to nose, heel-to-shin bilaterally there was no truncal ataxia  Gait: Rising up from seated position by pushing on chair arm, cautious, unsteady, bilateral foot drop, right worse than left   Romberg signs: Negative  Deep tendon reflexes: Brachioradialis 2/2, biceps 2/2, triceps 2/2, patellar 2/2, Achilles trace, plantar responses were flexor bilaterally.   DIAGNOSTIC DATA (LABS, IMAGING, TESTING) - I reviewed patient records, labs, notes, testing and imaging myself where available.   ASSESSMENT AND PLAN  [name] is a 81 y.o. male  presenting with fatigable bilateral ptosis, binocular double vision, proximal weakness, swallowing difficulty  1. Most consistent with generalized myasthenia gravis,  He needs MRI of the brain without contrast, acetylcholine receptor antibody, to confirm the diagnosis. IV hydration, speech evaluations, respiratory therapist evaluation for baseline.  I have discussed with   patient, and his family, he had rapid weight loss, difficulty swallowing, weak cough, mild breathing difficulty, he might experience worsening  difficulties during initial treatment, especially with high dose of steroid, increased secretion with Mestinon treatment.  Patient's and family decided to proceed with hospital admission, he will benefit high dose of steroid, plasma exchange versus IVIG, Mestinon 60 mg 3 times a day,    I have talked with neuro hospitalist Dr. Roseanne Reno, and [facility] hospitalist, for direct admission.   Will  continue to follow up  at clinic after discharge  Levert Feinstein, M.D. Ph.D.  [facility] [address], [address] [place], [place] [address] Ph: [phone] Fax: [phone]

## 2014-06-12 NOTE — H&P (Signed)
Nif and VC performed at this time per MD order. Pt obtained VC of 2.0L which was best of 3 and Nif of >-60cm H20 best of 3. Pt tolerated well and had good effort.

## 2014-06-12 NOTE — Progress Notes (Signed)
INITIAL NUTRITION ASSESSMENT  DOCUMENTATION CODES Per approved criteria  -Severe malnutrition in the context of chronic illness   Pt meets criteria for severe MALNUTRITION in the context of chronic illness as evidenced by 19.8% wt loss x 6 months, severe fat and muscle depletion.  INTERVENTION: -Continue with Ensure Complete po BID, each supplement provides 350 kcal and 13 grams of protein -RD to follow for diet recommendations based on SLP evaluation  NUTRITION DIAGNOSIS: Malnutrition related to decreased oral intake, dysphagia as evidenced by diet hx, moderate to severe fat and muscle depletion.   Goal: Pt will meet >90% of estimated nutritional needs  Monitor:  PO/supplement intake, labs, weight changes, I/O's  Reason for Assessment: MST=4  81 y.o. male  Admitting Dx: <principal problem not specified>  [name] is a 81 y.o. male presenting with fatigable bilateral ptosis, binocular double vision, proximal weakness, swallowing difficulty.   ASSESSMENT: Pt reports an overall decline in health over the past 6 months, including decreased mobility, weight loss, and difficulty swallowing.  He reports that when he swallows foods "hang" in his throat and won't go down. He reports that his diet PTA is very limited, consisting mainly of oatmeal and Ensure supplements.  He reports UBW of 162#, which he last weighed approximately 6 months ago (19.8% wt loss, which is significant). He also reports that he has "no muscle".  Spoke with RN who reports pt is awaiting swallow evaluation and tray is being held until pt is seen by speech to ensure safety while eating.  No labs available for review at this time.   Nutrition Focused Physical Exam:  Subcutaneous Fat:  Orbital Region: moderate depletion Upper Arm Region: severe depletion Thoracic and Lumbar Region: severe depletion  Muscle:  Temple Region: moderate depletion Clavicle Bone Region: severe depletion Clavicle and  Acromion Bone Region: severe depletion Scapular Bone Region: severe depletion Dorsal Hand: moderate depletion Patellar Region: severe depletion Anterior Thigh Region: mild depletion Posterior Calf Region: mild depletion  Edema: none present  Height: Ht Readings from Last 1 Encounters:  06/12/14 5\' 9"  (1.753 m)    Weight: Wt Readings from Last 1 Encounters:  06/12/14 130 lb 1.1 oz (59 kg)    Ideal Body Weight: 160#  % Ideal Body Weight: 81%  Wt Readings from Last 10 Encounters:  06/12/14 130 lb 1.1 oz (59 kg)  06/12/14 131 lb (59.421 kg)    Usual Body Weight: 162#  % Usual Body Weight: 80%  BMI:  Body mass index is 19.2 kg/(m^2). Normal weight range  Estimated Nutritional Needs: Kcal: 1750-1950 Protein: 71-81 grams Fluid: 1.7-1.9 L  Skin: No issues noted  Diet Order: DIET - DYS 1  EDUCATION NEEDS: -Education needs addressed  No intake or output data in the 24 hours ending 06/12/14 1313  Last BM: PTA  Labs:  No results for input(s): NA, K, CL, CO2, BUN, CREATININE, CALCIUM, MG, PHOS, GLUCOSE in the last 168 hours.  CBG (last 3)  No results for input(s): GLUCAP in the last 72 hours.  Scheduled Meds: . benazepril  20 mg Oral Daily  . feeding supplement (ENSURE COMPLETE)  237 mL Oral BID BM  . finasteride  5 mg Oral Daily  . heparin  5,000 Units Subcutaneous 3 times per day  . Immune Globulin 5%  400 mg/kg Intravenous Q24 Hr x 5  . pyridostigmine  60 mg Oral 3 times per day  . sodium chloride  3 mL Intravenous Q12H    Continuous Infusions: . sodium  chloride      Past Medical History  Diagnosis Date  . Hypertension   . Carotid artery disease   . Weight loss, unintentional   . Protein-calorie malnutrition, moderate     Past Surgical History  Procedure Laterality Date  . Hernia repair      Bilateral  . Prostate biopsy      WNL  . Hemorrhoid surgery    . Cataract extraction      Sarabi Sockwell A. Mayford Knife, RD, LDN, CDE Pager: [phone] After  hours Pager: [phone]

## 2014-06-13 ENCOUNTER — Inpatient Hospital Stay (HOSPITAL_COMMUNITY): Payer: Medicare Other

## 2014-06-13 DIAGNOSIS — T17998D Other foreign object in respiratory tract, part unspecified causing other injury, subsequent encounter: Secondary | ICD-10-CM

## 2014-06-13 DIAGNOSIS — T17908A Unspecified foreign body in respiratory tract, part unspecified causing other injury, initial encounter: Secondary | ICD-10-CM | POA: Insufficient documentation

## 2014-06-13 DIAGNOSIS — R29898 Other symptoms and signs involving the musculoskeletal system: Secondary | ICD-10-CM

## 2014-06-13 DIAGNOSIS — I10 Essential (primary) hypertension: Secondary | ICD-10-CM

## 2014-06-13 DIAGNOSIS — K219 Gastro-esophageal reflux disease without esophagitis: Secondary | ICD-10-CM

## 2014-06-13 DIAGNOSIS — E43 Unspecified severe protein-calorie malnutrition: Secondary | ICD-10-CM

## 2014-06-13 DIAGNOSIS — G7000 Myasthenia gravis without (acute) exacerbation: Principal | ICD-10-CM

## 2014-06-13 LAB — BASIC METABOLIC PANEL
Anion gap: 10 (ref 5–15)
BUN: 29 mg/dL — ABNORMAL HIGH (ref 6–23)
CO2: 22 mmol/L (ref 19–32)
Calcium: 9.5 mg/dL (ref 8.4–10.5)
Chloride: 107 mmol/L (ref 96–112)
Creatinine, Ser: 1.37 mg/dL — ABNORMAL HIGH (ref 0.50–1.35)
GFR calc Af Amer: 53 mL/min — ABNORMAL LOW (ref >90)
GFR, EST NON AFRICAN AMERICAN: 46 mL/min — AB (ref >90)
GLUCOSE: 132 mg/dL — AB (ref 70–99)
POTASSIUM: 4 mmol/L (ref 3.5–5.1)
Sodium: 139 mmol/L (ref 135–145)

## 2014-06-13 LAB — CBC
HCT: 39.2 % (ref 39.0–52.0)
Hemoglobin: 12.4 g/dL — ABNORMAL LOW (ref 13.0–17.0)
MCH: 29.5 pg (ref 26.0–34.0)
MCHC: 31.6 g/dL (ref 30.0–36.0)
MCV: 93.3 fL (ref 78.0–100.0)
PLATELETS: 154 10*3/uL (ref 150–400)
RBC: 4.2 MIL/uL — ABNORMAL LOW (ref 4.22–5.81)
RDW: 16.2 % — AB (ref 11.5–15.5)
WBC: 4.3 10*3/uL (ref 4.0–10.5)

## 2014-06-13 LAB — STRIATED MUSCLE ANTIBODY: Anti-striation Abs: NEGATIVE

## 2014-06-13 LAB — ACETYLCHOLINE RECEPTOR, BINDING: Acety choline binding ab: 0.04 nmol/L (ref 0.00–0.24)

## 2014-06-13 MED ORDER — WHITE PETROLATUM GEL
Status: AC
  Administered 2014-06-13: 1
  Filled 2014-06-13: qty 1

## 2014-06-13 NOTE — Progress Notes (Signed)
Subjective: Patient had no new complaints. He still experiencing some difficulty with swallowing and eyelid ptosis as well as intermittent diplopia.  Objective: Current vital signs: BP 140/75 mmHg  Pulse 84  Temp(Src) 98 F (36.7 C) (Oral)  Resp 20  Ht 5\' 9"  (1.753 m)  Wt 59 kg (130 lb 1.1 oz)  BMI 19.20 kg/m2  SpO2 99%  Neurologic Exam: He was alert and in no acute distress. Mental status was normal. Extraocular movements were full and conjugate. Mild right lid ptosis and moderate left lid ptosis noted. He had minimal weakness of the orbicularis oris oculi and orbicularis oris muscles. Neck flexor strength was 4-/5; extensors were normal. Upper extremity strength was normal except for 4/5 strength of deltoid muscle bilaterally. Lower extremities were normal except for 4/5 strength of hip flexors bilaterally.  Medications: I have reviewed the patient's current medications.  Assessment/Plan: 81 year old man admitted for management of exacerbation of myasthenia gravis. No clear precipitating factor has been identified.  Plan: 1. Continue IVIG daily for total of 5 treatments. However, if patient is not showing significant improvement after third treatment, plasmapheresis will be considered. 2. Continue Mestinon at current dose. 3. Continue physical therapy, occupational therapy and SLP intervention.  C.R. Roseanne RenoStewart, MD [facility] [phone]  06/13/2014  8:38 AM

## 2014-06-13 NOTE — Progress Notes (Signed)
Physical Therapy Treatment Patient Details Name: [name] MRN: [id number] DOB: 1929-02-17 Today's Date: 06/13/2014    History of Present Illness [name] is a 81 y.o. male with a past medical history of hypertension, gastroesophageal reflux disease, presenting as a transfer from his neurologist office. Patient having progressive generalized weakness that started 6 months to 1 year ago, that has been associated with diplopia, dysphasia, and bilateral ptosis. Patient states that symptoms occurring throughout the day however on occasion have been worse at nighttime. He reports having multiple falls in in the past with his last fall being one month ago. He also reports a 20 pound weight loss throughout this period, attributing this to minimal by mouth intake stating he cannot have solid foods. He was evaluated by his neurologist today felt that symptoms likely secondary to myasthenia gravis for which he was referred for further workup and initiation of therapy    PT Comments    Pt with overall generalized weakness and fatigue.  Pt had just come back from an AM test and had not eaten breakfast.  After ambulation of 100' with RW and min/guard, pt set up in chair with breakfast.    Follow Up Recommendations  CIR     Equipment Recommendations  Other (comment) (TBD)    Recommendations for Other Services       Precautions / Restrictions Precautions Precautions: Fall    Mobility  Bed Mobility               General bed mobility comments: up in recliner upon arrival  Transfers Overall transfer level: Needs assistance Equipment used: Rolling walker (2 wheeled) Transfers: Sit to/from Stand Sit to Stand: Min guard         General transfer comment: cues to scoot to edge of chair first before standing.  Ambulation/Gait Ambulation/Gait assistance: Min guard Ambulation Distance (Feet): 100 Feet Assistive device: Rolling walker (2 wheeled) Gait Pattern/deviations:  Step-through pattern;Decreased step length - right;Decreased step length - left Gait velocity: slower   General Gait Details: General weakness and fatigue noted with gait.  Pt subjectively reporting he just feels "so weak".  Some difficulty maneuvering the RW in tight spaces in the room and needed cues to keep RW with him.   Stairs            Wheelchair Mobility    Modified Rankin (Stroke Patients Only)       Balance             Standing balance-Leahy Scale: Fair                      Cognition Arousal/Alertness: Awake/alert Behavior During Therapy: WFL for tasks assessed/performed Overall Cognitive Status: Within Functional Limits for tasks assessed                      Exercises      General Comments General comments (skin integrity, edema, etc.): Pt's breakfast had arrived while ambulating and pt was ready to eat.  Verbally reviewed therex he could do in sitting and in supine      Pertinent Vitals/Pain Pain Assessment: No/denies pain    Home Living                      Prior Function            PT Goals (current goals can now be found in the care plan section) Acute Rehab PT Goals Patient  Stated Goal: Be safe independent at home. PT Goal Formulation: With patient Time For Goal Achievement: 06/26/14 Potential to Achieve Goals: Good Progress towards PT goals: Progressing toward goals    Frequency  Min 3X/week    PT Plan Current plan remains appropriate    Co-evaluation             End of Session   Activity Tolerance: Patient limited by fatigue;Patient tolerated treatment well Patient left: in chair;with chair alarm set;with call bell/phone within reach     Time: 0900-0911 PT Time Calculation (min) (ACUTE ONLY): 11 min  Charges:  $Gait Training: 8-22 mins                    G Codes:      Genavieve Mangiapane LUBECK 06/13/2014, 9:30 AM

## 2014-06-13 NOTE — Progress Notes (Signed)
NIF >-60, VC 2.0 good effort

## 2014-06-13 NOTE — Evaluation (Signed)
Occupational Therapy Evaluation Patient Details Name: [name] MRN: [id number] DOB: 1928-06-03 Today's Date: 06/13/2014    History of Present Illness Pt is an 81 y.o. Male admitted 06/12/14 from his neurologist office due to progressive generalized weakness and associated diplopia, dysphagia, ptosis. Pt has had multiple falls at home. Neurologist admitted pt due to symptoms likely 2/2 myasthenia gravis. PMH: HTN, GERD. Pt now being treated for myasthenia gravis on IVIG and Mestinon. MRI brain showed no acute findings. MBS by SLP showed dysphagia.    Clinical Impression   PTA pt lived at home and was independent with ADLs, although he admits that he was needing increased assistance due to generalized and progressive weakness. Pt presents with deconditioning, generalized weakness, and decreased activity tolerance. Pt demonstrates unsafe use of RW and has multiple instances of LOB and staggering during realistic home environment situations, including picking up the RW to maneuver around obstacles or in tight spaces, further increased his fall risk. Pt has a hx of falls at home and feel that he is not safe, however he would be an excellent candidate for intensive therapy to return to PLOF and enable return home with family support. Pt's family lives across the street and are very supportive.     Follow Up Recommendations  CIR;Supervision/Assistance - 24 hour    Equipment Recommendations  Other (comment) (TBD)    Recommendations for Other Services       Precautions / Restrictions Precautions Precautions: Fall Precaution Comments: pt/family report multiple falls at home Restrictions Weight Bearing Restrictions: No      Mobility Bed Mobility Overal bed mobility: Needs Assistance Bed Mobility: Supine to Sit;Sit to Supine     Supine to sit: Min assist Sit to supine: Min assist   General bed mobility comments: Pt required heavy min assist with hand held assist to pull to sitting  at EOB. Pt able to use bed rail to assist. VC's for sequencing to get pt to scoot to EOB. Pt required assistance to return LEs to bed when returning supine.   Transfers Overall transfer level: Needs assistance Equipment used: Rolling walker (2 wheeled) Transfers: Sit to/from Stand Sit to Stand: Min assist         General transfer comment: Min A to stand. Pt demonstrates unsafe technique and pulls on RW. Multimodal cues for hand placement and min A to power up to standing.     Balance Overall balance assessment: Needs assistance Sitting-balance support: No upper extremity supported Sitting balance-Leahy Scale: Fair     Standing balance support: Bilateral upper extremity supported;During functional activity Standing balance-Leahy Scale: Poor Standing balance comment: Pt requires UE support for dynamic balance. Pt with LOB when reaching for items at sink.                             ADL Overall ADL's : Needs assistance/impaired Eating/Feeding: Supervision/ safety;Sitting (90*, with full supervision per SLP)   Grooming: Minimal assistance;Standing;Wash/dry hands Grooming Details (indicate cue type and reason): pt needed VC's to locate items needed. Min A to stand.  Upper Body Bathing: Set up;Sitting   Lower Body Bathing: Moderate assistance;Sit to/from stand Lower Body Bathing Details (indicate cue type and reason): Assist to reach Bil LEs and min A to stand Upper Body Dressing : Minimal assistance;Sitting Upper Body Dressing Details (indicate cue type and reason): due to truncal weakness Lower Body Dressing: Maximal assistance;Sit to/from stand   Toilet Transfer: Minimal assistance;Ambulation;RW;Comfort height toilet;Grab  bars Toilet Transfer Details (indicate cue type and reason): VC's for hand placement. Min A to power up due to LE weakness. Pt demonstrated unsafe technique qith  Toileting- Clothing Manipulation and Hygiene: Minimal assistance;Sitting/lateral  lean Toileting - Clothing Manipulation Details (indicate cue type and reason): Pt performed toilet hygiene in sitting and required min A to stand       General ADL Comments: Pt presents with weakness and decreased endurance. Pt requires assistance to get OOB and when ambulating, pt required min A to maintain balance due to instances of stumbling. Without VC's pt picked up RW to maneuver around obstacles and feel that pt would be a high fall risk if returning home by himself. Pt required min A to power up from bed/toilet due to LE weakness.      Vision Additional Comments: pt reports diplopia at times (not currently). Noted ptosis. Pt required cues to locate items for grooming when standing at sink          Pertinent Vitals/Pain Pain Assessment: No/denies pain     Hand Dominance Right   Extremity/Trunk Assessment Upper Extremity Assessment Upper Extremity Assessment: Generalized weakness (generally 3/5 in Bil UEs)   Lower Extremity Assessment Lower Extremity Assessment: Defer to PT evaluation   Cervical / Trunk Assessment Cervical / Trunk Assessment:  (truncal weakness)   Communication Communication Communication: No difficulties   Cognition Arousal/Alertness: Awake/alert Behavior During Therapy: WFL for tasks assessed/performed;Flat affect Overall Cognitive Status: Within Functional Limits for tasks assessed                                Home Living Family/patient expects to be discharged to:: Private residence Living Arrangements: Alone Available Help at Discharge: Family;Available 24 hours/day (live across the street) Type of Home: House Home Access: Stairs to enter     Home Layout: One level;Other (Comment) (1 room is raised/sunk by 1 step)     Bathroom Shower/Tub: Tub/shower unit Shower/tub characteristics: Engineer, building servicesCurtain Bathroom Toilet: Standard     Home Equipment: None          Prior Functioning/Environment Level of Independence: Independent  ("cruised" along furniture)             OT Diagnosis: Generalized weakness;Disturbance of vision   OT Problem List: Decreased strength;Decreased range of motion;Decreased activity tolerance;Impaired balance (sitting and/or standing);Impaired vision/perception;Decreased safety awareness;Decreased knowledge of use of DME or AE   OT Treatment/Interventions: Self-care/ADL training;Therapeutic exercise;Energy conservation;DME and/or AE instruction;Therapeutic activities;Patient/family education;Balance training;Visual/perceptual remediation/compensation    OT Goals(Current goals can be found in the care plan section) Acute Rehab OT Goals Patient Stated Goal: To return home OT Goal Formulation: With patient Time For Goal Achievement: 06/27/14 Potential to Achieve Goals: Good ADL Goals Pt Will Perform Grooming: with supervision;standing Pt Will Perform Lower Body Bathing: with supervision;with set-up;sit to/from stand Pt Will Perform Upper Body Dressing: with set-up;with supervision;sitting Pt Will Perform Lower Body Dressing: with set-up;with supervision;sit to/from stand Pt Will Transfer to Toilet: with supervision;ambulating;bedside commode Pt Will Perform Toileting - Clothing Manipulation and hygiene: with supervision;sit to/from stand Additional ADL Goal #1: Pt will perform bed mobility with Supervision to prepare for ADLs.   OT Frequency: Min 2X/week    End of Session Equipment Utilized During Treatment: Gait belt;Rolling walker  Activity Tolerance: Patient limited by fatigue Patient left: in bed;with call bell/phone within reach;with bed alarm set;with family/visitor present   Time: [id number] OT Time Calculation (min): 55 min Charges:  OT General Charges $OT Visit: 1 Procedure OT Evaluation $Initial OT Evaluation Tier I: 1 Procedure OT Treatments $Self Care/Home Management : 23-37 mins $Therapeutic Activity: 8-22 mins G-Codes:    Rae Lips Jun 24, 2014, 3:46  PM  Yehuda Mao Guido Sander, OTR/L Occupational Therapist [phone] (pager)

## 2014-06-13 NOTE — Progress Notes (Signed)
Rehab Admissions Coordinator Note:  Patient was screened by Thatcher Doberstein L for appropriateness for an Inpatient Acute Rehab Consult.  At this time, we are recommending Inpatient Rehab consult. I have noted pt's progress with PT and that pt is ambulating 100' with RW at min guard assist. I also noted SLP assessment : Moderate oral and severe pharyngo-cervical esophageal dysphagia with significant weak muscular contraction.    Electa Sterry L 06/13/2014, 10:06 AM  I can be reached at [phone].

## 2014-06-13 NOTE — Progress Notes (Signed)
Moderate oral phase dysphagia;Severe pharyngeal phase dysphagia;Severe cervical esophageal phase dysphagia   Clinical impression Moderate oral and severe pharyngo-cervical esophageal dysphagia with significant weak muscular contraction. Said weakness results in severe pharyngeal stasis across all consistencies that mix with secretions retained. Liquid swallows faciliated clearance but resulted in mild aspiration of thin with sensation. Pt is able to expectorate to clear vallecular residuals he cannot clear into esophagus. Pt expectorated viscous secretions throughout entire evaluation-retained secretions he is unable to swallow. Pt is a high aspiration risk with all intake but apparently has been tolerating given his negative CXR. He does report issues with excessive secretions for the last few months. MD may desire to allow pt to continue po intake. Concern for pt to be able to support self nutritionally at this time due to level of dysphagia.       [name] Harish Bram, MS [facility] SLP [phone]   Full report in imaging section

## 2014-06-13 NOTE — Progress Notes (Signed)
PROGRESS NOTE  [name]:[id number] DOB: 12-09-1928 DOA: 06/12/2014 PCP: Marylynn Pearson, MD  HPI/Subjective: [name] is an 81 year old male with a PMH of HTN, carotid artery diease, and GERD who presents with approximately one year history of progressive weakness. Patient states that symptoms have been worsening and now has difficulty ambulating resulting in frequent falls. Patient also reports difficulty swallowing, fatigue, and diplopia. Has experienced a reported 20lb weight loss since symptoms began thought to be due to associated difficulty swallowing. Patient was referred by Dr. Terrace Arabia of [facility] for further evaluation for Myasthenia Gravis and has been evaluated by Dr. Roseanne Reno in Neurology since admission.  MRI shows no acute abnormalities. Swallow evaluation indicated moderate to severe dysphagia indicating the needed for Dysphagia 2 thin diet. Labs including MG panel, striated muscle antibody, and acetylcholine receptor are still pending.   Today, [name] is feeling very weak. He has been started on mestinon and IVIG as per neurology consult. States he has been experiencing abdominal pain but was unable to give a clear onset- likely just prior to admission. Denies nausea, vomiting, and diarrhea. Patients primary concerns are weakness as he is unable to ambulate independently and weight loss.   Review of Systems  Constitutional: Positive for weight loss. Negative for fever.  HENT: Negative for congestion.   Eyes: Positive for double vision.  Respiratory: Positive for shortness of breath (with activity). Negative for cough and hemoptysis.   Cardiovascular: Negative for chest pain.  Gastrointestinal: Positive for abdominal pain. Negative for nausea, vomiting and diarrhea.  Genitourinary: Negative for dysuria and frequency.  Musculoskeletal: Positive for falls. Negative for myalgias and joint pain.  Neurological: Positive for weakness (generalized  but particularly lower extremities). Negative for focal weakness and headaches.     Assessment/Plan:    Myasthenia gravis -Probable, as evidenced by weakness, diplopia, ptosis, and dysphagia  -labs still pending -continue IVIG and mestinon and get PT -If no improvement after the third treatment,  plasmapheresis will be done.    Weakness of both legs -Likely secondary to MG -Patient has history of difficulty ambulating and frequent falls -Continue treatment plan for MG and consider PT consult  Dysphagia - likely secondary to MG -Swallowing evaluation indicates moderate to severe dysphagia -Changed to dysphagia 2 with thin liquid diet -Will revaluate if patient improves with MG treatement    GERD (gastroesophageal reflux disease) -continue prilosec 20mg     HTN (hypertension) -continue Lotensin 20mg     Protein-calorie malnutrition, severe -Likely secondary to dysphagia resulting from MG  -See dysphagia    DVT Prophylaxis:  heparin  Code Status: full Family Communication: no family at bedside  Disposition Plan: CIR vs SNF on D/C   Consultants:  Neurology  Speech/Language   PT  Procedures:  None  Antibiotics:  none  Objective: Filed Vitals:   06/13/14 0052 06/13/14 0150 06/13/14 0231 06/13/14 0719  BP: 134/65 148/68 154/89 140/75  Pulse: 100 97 107 84  Temp: 98.4 F (36.9 C) 98 F (36.7 C) 98.4 F (36.9 C) 98 F (36.7 C)  TempSrc: Oral Oral Oral Oral  Resp: 16  16 20   Height:      Weight:      SpO2: 93% 94% 92% 99%    Intake/Output Summary (Last 24 hours) at 06/13/14 1045 Last data filed at 06/13/14 0954  Gross per 24 hour  Intake   1360 ml  Output    470 ml  Net    890 ml   Filed  Weights   06/12/14 1223  Weight: 59 kg (130 lb 1.1 oz)    Exam: General: Well developed, well nourished, appears weak and lethargic, appears stated age  HEENT:  PERR, Anicteic Sclera, MMM. No pharyngeal erythema or exudates  Neck: Supple, no JVD, no masses   Cardiovascular: RRR, S1 S2 auscultated, no rubs, murmurs or gallops.   Respiratory: Clear to auscultation bilaterally with equal chest rise  Abdomen: Soft, tender to palpation in all 4 quadrants, nondistended, + bowel sounds  Extremities: warm dry without cyanosis clubbing or edema.  Neuro: AAOx3, obvious ptosis of left lid. Skin: Without rashes exudates or nodules.   Psych: Normal affect and demeanor with intact judgement and insight   Data Reviewed: Basic Metabolic Panel:  Recent Labs Lab 06/12/14 1331  NA 139  K 4.0  CL 105  CO2 25  GLUCOSE 93  BUN 33*  CREATININE 1.49*  CALCIUM 9.8   Liver Function Tests:  Recent Labs Lab 06/12/14 1331  AST 113*  ALT 75*  ALKPHOS 36*  BILITOT 0.7  PROT 6.2  ALBUMIN 2.5*  CBC:  Recent Labs Lab 06/12/14 1331  WBC 4.4  HGB 11.8*  HCT 37.6*  MCV 93.8  PLT 141*    Studies: Mr Brain Wo Contrast  06/13/2014   CLINICAL DATA:  Progressive generalized weakness with diplopia, dysphagia, and bilateral ptosis. Suspected myasthenia gravis. Initial evaluation.  EXAM: MRI HEAD WITHOUT CONTRAST  TECHNIQUE: Multiplanar, multiecho pulse sequences of the brain and surrounding structures were obtained without intravenous contrast.  COMPARISON:  None.  FINDINGS: Diffuse prominence of the CSF containing spaces is compatible with generalized cerebral atrophy. No significant white matter changes present. No chronic infarction.  No abnormal foci of restricted diffusion to suggest acute intracranial infarct. Gray-white matter differentiation maintained. Normal intravascular flow voids present. No acute or chronic intracranial hemorrhage.  No mass lesion or midline shift. No hydrocephalus. No extra-axial fluid collection.  Craniocervical junction normal. Pituitary gland within normal limits. No acute abnormality seen about the orbits.  Paranasal sinuses and mastoid air cells are clear.  Mild degenerative changes noted within the upper cervical spine. Bone  marrow signal intensity normal. Scalp soft tissues within normal limits.  IMPRESSION: 1. No acute intracranial abnormality. 2. Moderate generalized cerebral atrophy.   Electronically Signed   By: Rise MuBenjamin  McClintock M.D.   On: 06/13/2014 00:49   Dg Chest Port 1 View  06/12/2014   CLINICAL DATA:  Cough. Questionable aspiration. Generalized weakness.  EXAM: PORTABLE CHEST - 1 VIEW  COMPARISON:  Chest CT, 12/17/2013.  FINDINGS: The heart size and mediastinal contours are within normal limits. Both lungs are clear. The visualized skeletal structures are unremarkable.  IMPRESSION: No active disease.   Electronically Signed   By: Amie Portlandavid  Ormond M.D.   On: 06/12/2014 16:01   Dg Swallowing Func-speech Pathology  06/13/2014    Objective Swallowing Evaluation:    Patient Details  Name: Salina AprilBilly W Boller MRN: [id number] Date of Birth: Feb 04, 1929  Today's Date: 06/13/2014 Time: SLP Start Time (ACUTE ONLY): 0815-SLP Stop Time (ACUTE ONLY): 0850 SLP Time Calculation (min) (ACUTE ONLY): 35 min  Past Medical History:  Past Medical History  Diagnosis Date  . Hypertension   . Carotid artery disease   . Weight loss, unintentional   . Protein-calorie malnutrition, moderate    Past Surgical History:  Past Surgical History  Procedure Laterality Date  . Hernia repair      Bilateral  . Prostate biopsy      WNL  .  Hemorrhoid surgery    . Cataract extraction     HPI:  HPI: Hazem Kenner Boldman is a 81 y.o. male with a past medical history of  hypertension, gastroesophageal reflux disease, presenting as a transfer  from his neurologist office. Patient having progressive generalized  weakness that started 6 months to 1 year ago, that has been associated  with diplopia, dysphasia, and bilateral ptosis. Patient states that  symptoms occurring throughout the day however on occasion have been worse  at nighttime. He reports having multiple falls in in the past with his  last fall being one month ago. He also reports a 20 pound weight loss  throughout  this period, attributing this to minimal by mouth intake  stating he cannot have solid foods. He was evaluated by his neurologist on  day of admit felt that symptoms likely secondary to myasthenia gravis.   MRI brain and CXR negative.    No Data Recorded  Assessment / Plan / Recommendation CHL IP CLINICAL IMPRESSIONS 06/13/2014  Dysphagia Diagnosis Moderate oral phase dysphagia;Severe pharyngeal phase  dysphagia;Severe cervical esophageal phase dysphagia  Clinical impression Moderate oral and severe pharyngo-cervical esophageal  dysphagia with significant weak muscular contraction.  Said weakness  results in severe pharyngeal stasis across all consistencies that mix with  secretions retained.  Liquid swallows faciliated clearance but resulted in  mild aspiration of thin with sensation.  Pt is able to expectorate to  clear vallecular residuals he cannot clear into esophagus.  Pt  expectorated viscous secretions throughout entire evaluation-retained  secretions he is unable to swallow.   Pt is a high aspiration risk with  all intake but apparently has been tolerating given his negative CXR.  He  does report issues with excessive secretions for the last few months.  MD  may desire to allow pt to continue po intake.  Concern for pt to be able  to support self nutritionally at this time due to level of dysphagia.        CHL IP TREATMENT RECOMMENDATION 06/13/2014  Treatment Plan Recommendations Therapy as outlined in treatment plan below      CHL IP DIET RECOMMENDATION 06/13/2014  Diet Recommendations Ice chips PRN after oral care;Dysphagia 2 (Fine  chop);Thin liquid  Liquid Administration via Cup;Straw  Medication Administration Crushed with puree  Compensations Slow rate;Small sips/bites;Multiple dry swallows after each  bite/sip;Follow solids with liquid;Effortful swallow;Hard cough after  swallow  Postural Changes and/or Swallow Maneuvers Seated upright 90  degrees;Upright 30-60 min after meal     CHL IP OTHER  RECOMMENDATIONS 06/13/2014  Recommended Consults (None)  Oral Care Recommendations Oral care BID  Other Recommendations (None)     CHL IP FOLLOW UP RECOMMENDATIONS 06/13/2014  Follow up Recommendations None     CHL IP FREQUENCY AND DURATION 06/13/2014  Speech Therapy Frequency (ACUTE ONLY) (None)  Treatment Duration 2 weeks      CHL IP REASON FOR REFERRAL 06/13/2014  Reason for Referral Objectively evaluate swallowing function     CHL IP ORAL PHASE 06/13/2014  Lips (None)  Tongue (None)  Mucous membranes (None)  Nutritional status (None)  Other (None)  Oxygen therapy (None)  Oral Phase Impaired  Oral - Pudding Teaspoon (None)  Oral - Pudding Cup (None)  Oral - Honey Teaspoon (None)  Oral - Honey Cup (None)  Oral - Honey Syringe (None)  Oral - Nectar Teaspoon (None)  Oral - Nectar Cup Weak lingual manipulation;Delayed oral transit;Reduced  posterior propulsion;Piecemeal swallowing  Oral - Nectar Straw (None)  Oral - Nectar Syringe (None)  Oral - Ice Chips (None)  Oral - Thin Teaspoon Weak lingual manipulation;Delayed oral  transit;Reduced posterior propulsion;Piecemeal swallowing  Oral - Thin Cup Weak lingual manipulation;Delayed oral transit;Reduced  posterior propulsion;Piecemeal swallowing  Oral - Thin Straw (None)  Oral - Thin Syringe (None)  Oral - Puree Weak lingual manipulation;Delayed oral transit;Reduced  posterior propulsion  Oral - Mechanical Soft Weak lingual manipulation;Delayed oral  transit;Reduced posterior propulsion  Oral - Regular (None)  Oral - Multi-consistency (None)  Oral - Pill (None)  Oral Phase - Comment (None)      CHL IP PHARYNGEAL PHASE 06/13/2014  Pharyngeal Phase Impaired  Pharyngeal - Pudding Teaspoon (None)  Penetration/Aspiration details (pudding teaspoon) (None)  Pharyngeal - Pudding Cup (None)  Penetration/Aspiration details (pudding cup) (None)  Pharyngeal - Honey Teaspoon (None)  Penetration/Aspiration details (honey teaspoon) (None)  Pharyngeal - Honey Cup (None)   Penetration/Aspiration details (honey cup) (None)  Pharyngeal - Honey Syringe (None)  Penetration/Aspiration details (honey syringe) (None)  Pharyngeal - Nectar Teaspoon (None)  Penetration/Aspiration details (nectar teaspoon) (None)  Pharyngeal - Nectar Cup Reduced pharyngeal peristalsis;Reduced epiglottic  inversion;Reduced tongue base retraction;Reduced airway/laryngeal  closure;Reduced laryngeal elevation;Pharyngeal residue -  valleculae;Pharyngeal residue - pyriform sinuses;Reduced anterior  laryngeal mobility  Penetration/Aspiration details (nectar cup) (None)  Pharyngeal - Nectar Straw (None)  Penetration/Aspiration details (nectar straw) (None)  Pharyngeal - Nectar Syringe (None)  Penetration/Aspiration details (nectar syringe) (None)  Pharyngeal - Ice Chips (None)  Penetration/Aspiration details (ice chips) (None)  Pharyngeal - Thin Teaspoon Reduced pharyngeal peristalsis;Reduced  epiglottic inversion;Reduced tongue base retraction;Reduced  airway/laryngeal closure;Reduced laryngeal  elevation;Penetration/Aspiration during swallow;Pharyngeal residue -  valleculae;Pharyngeal residue - pyriform sinuses;Reduced anterior  laryngeal mobility  Penetration/Aspiration details (thin teaspoon) Material enters airway,  remains ABOVE vocal cords and not ejected out  Pharyngeal - Thin Cup Reduced pharyngeal peristalsis;Reduced epiglottic  inversion;Pharyngeal residue - valleculae;Pharyngeal residue - pyriform  sinuses;Trace aspiration;Penetration/Aspiration during  swallow;Penetration/Aspiration after swallow;Reduced anterior laryngeal  mobility  Penetration/Aspiration details (thin cup) Material enters airway, passes  BELOW cords and not ejected out despite cough attempt by patient  Pharyngeal - Thin Straw (None)  Penetration/Aspiration details (thin straw) (None)  Pharyngeal - Thin Syringe (None)  Penetration/Aspiration details (thin syringe') (None)  Pharyngeal - Puree Reduced pharyngeal peristalsis;Reduced  epiglottic  inversion;Pharyngeal residue - valleculae;Reduced tongue base  retraction;Reduced airway/laryngeal closure;Reduced laryngeal  elevation;Reduced anterior laryngeal mobility  Penetration/Aspiration details (puree) (None)  Pharyngeal - Mechanical Soft Reduced pharyngeal peristalsis;Reduced  epiglottic inversion;Pharyngeal residue - valleculae;Reduced laryngeal  elevation;Reduced airway/laryngeal closure;Reduced tongue base  retraction;Reduced anterior laryngeal mobility  Penetration/Aspiration details (mechanical soft) (None)  Pharyngeal - Regular (None)  Penetration/Aspiration details (regular) (None)  Pharyngeal - Multi-consistency (None)  Penetration/Aspiration details (multi-consistency) (None)  Pharyngeal - Pill (None)  Penetration/Aspiration details (pill) (None)  Pharyngeal Comment pharyngeal residuals *(mixing with viscous secretions)  across consistencies noted with pt awareness - liquid swallows faciliated  clearance but resulted in mild aspiration, pt is able to expectorate to  clear vallecular residuals he does not swallow     CHL IP CERVICAL ESOPHAGEAL PHASE 06/13/2014  Cervical Esophageal Phase Impaired                    Nectar Cup Reduced cricopharyngeal relaxation        Thin Teaspoon Reduced cricopharyngeal relaxation  Mickie Bail [place], [place] [facility] SLP [phone]     Scheduled Meds: . benazepril  20 mg Oral Daily  . feeding supplement (ENSURE COMPLETE)  237 mL Oral BID BM  . finasteride  5 mg Oral Daily  . heparin  5,000 Units Subcutaneous 3 times per day  . Immune Globulin 5%  400 mg/kg Intravenous Q24 Hr x 5  . pyridostigmine  60 mg Oral 3 times per day  . sodium chloride  1,000 mL Intravenous Once  . sodium chloride  3 mL Intravenous Q12H   Continuous Infusions: . sodium chloride 75 mL/hr at 06/13/14 0154    Active Problems:   Myasthenia gravis   Weakness  of both legs   GERD (gastroesophageal reflux disease)   HTN (hypertension)   Protein-calorie malnutrition, severe    Raspect, Erin, PA-S [facility] 06/13/2014, 10:45 AM    Addendum  Patient seen and examined, chart and data base reviewed.  I agree with the above assessment and plan.  For full details please see Mrs. Raspect, Erin, PA-S note.  I reviewed and amended the above note as appropriate.  This note is for educational purposes, for billing purposes please refer to my note.   Clint Lipps, MD [facility] Pager: [phone] 06/13/2014, 3:27 PM

## 2014-06-13 NOTE — Progress Notes (Signed)
VC 1.75L and NIF-40cmH20. Good patient effort. Pt has a productive cough.

## 2014-06-13 NOTE — Progress Notes (Signed)
Speech Language Pathology Treatment: Dysphagia  Patient Details Name: [name] MRN: [id number] DOB: [birth year] Today's Date: 06/13/2014 Time: [id number] SLP Time Calculation (min) (ACUTE ONLY): 14 min  Assessment / Plan / Recommendation Clinical Impression  Pt seen to educate pt to plan of po diet and compensation strategies to mitigate dysphagia symptoms.  Provided pt in writing strategies and clinical reasoning for utilization of said strategies.  Pt benefited from max cues to reiterate compensations and would currently benefit from full supervision to maximize airway protection.    Spoke to MD re: MBS results and plan is to pursue po diet of dys2/thin with precautions in hopes of medical treatment improving dysphagia.    Will continue to follow for dysphagia management.     HPI HPI: [name] is a 81 y.o. male with a past medical history of hypertension, gastroesophageal reflux disease, presenting as a transfer from his neurologist office. Patient having progressive generalized weakness that started 6 months to 1 year ago, that has been associated with diplopia, dysphasia, and bilateral ptosis. Patient states that symptoms occurring throughout the day however on occasion have been worse at nighttime. He reports having multiple falls in in the past with his last fall being one month ago. He also reports a 20 pound weight loss throughout this period, attributing this to minimal by mouth intake stating he cannot have solid foods. He was evaluated by his neurologist on day of admit felt that symptoms likely secondary to myasthenia gravis.  MRI brain and CXR negative.     Pertinent Vitals Pain Assessment: No/denies pain (pt did not report pain)  SLP Plan  Continue with current plan of care    Recommendations Diet recommendations: Dysphagia 2 (fine chop);Thin liquid Liquids provided via: Cup;No straw Medication Administration: Crushed with puree Supervision: Patient able to self  feed Compensations: Slow rate;Small sips/bites;Multiple dry swallows after each bite/sip;Follow solids with liquid;Effortful swallow;Hard cough after swallow (start meal with liquids) Postural Changes and/or Swallow Maneuvers: Seated upright 90 degrees;Upright 30-60 min after meal (expectorate throughout meal)              Oral Care Recommendations: Oral care BID Follow up Recommendations: None Plan: Continue with current plan of care    GO Functional Limitations: Swallowing   Donavan Burnetamara Kaylean Tupou, MS [facility] SLP [phone]

## 2014-06-13 NOTE — Progress Notes (Signed)
Pt performed a NIF of -60 and VC of 1.8L. Patient shows good effort and had no complications.

## 2014-06-13 NOTE — Progress Notes (Signed)
[facility] PROGRESS NOTE   [name] ZOX:[id number] DOB: Feb 01, 1929 DOA: 06/12/2014 PCP: Ronald Pearson, MD  HPI/Subjective: Denies any significant complaints, some abdominal discomfort without nausea or vomiting, sitting on chair eating his breakfast.  Assessment/Plan: Active Problems:   Myasthenia gravis   Weakness of both legs   GERD (gastroesophageal reflux disease)   HTN (hypertension)   Protein-calorie malnutrition, severe    Myasthenia gravis, probable Presented with symptoms consistent with myasthenia gravis including intermittent diplopia, ptosis, dysphagia and weakness. Neurologic consulted. MRI of the brain, showed no acute findings, anticholinesterase receptors antibodies pending. Patient started empirically on IVIG and Mestinon. Per neurology plan if this does not work should he might need plasmapheresis.  Dysphagia Seen by SLP, MBSS is done and showed dysphagia, SLP recommended dysphagia 2 with thin liquids. Hopefully this will improve as myasthenia gravis symptoms improves.  Dehydration Poor oral intake and dysphagia, patient is on IV fluids.  Essential hypertension Home regimen of benazepril 20 mg continued. Check BMP in a.m. to ensure normal kidney function.  Severe protein calorie malnutrition Related to poor oral intake/dysphagia and likely myasthenia gravis. Registered dietitian to be consulted for further recommendation.  GERD Continue PPI therapy.  Code Status: Full Code Family Communication: Plan discussed with the patient. Disposition Plan: Remains inpatient Diet: DIET DYS 2  Consultants:  Neuro  Procedures:  None  Antibiotics:  None   Objective: Filed Vitals:   06/13/14 0719  BP: 140/75  Pulse: 84  Temp: 98 F (36.7 C)  Resp: 20    Intake/Output Summary (Last 24 hours) at 06/13/14 1107 Last data filed at 06/13/14 0954  Gross per 24 hour  Intake   1360 ml  Output    470 ml  Net    890 ml   Filed  Weights   06/12/14 1223  Weight: 59 kg (130 lb 1.1 oz)    Exam: General: Alert and awake, oriented x3, not in any acute distress. HEENT: anicteric sclera, pupils reactive to light and accommodation, EOMI CVS: S1-S2 clear, no murmur rubs or gallops Chest: clear to auscultation bilaterally, no wheezing, rales or rhonchi Abdomen: soft nontender, nondistended, normal bowel sounds, no organomegaly Extremities: no cyanosis, clubbing or edema noted bilaterally Neuro: Cranial nerves II-XII intact, no focal neurological deficits  Data Reviewed: Basic Metabolic Panel:  Recent Labs Lab 06/12/14 1331 06/13/14 1010  NA 139 139  K 4.0 4.0  CL 105 107  CO2 25 22  GLUCOSE 93 132*  BUN 33* 29*  CREATININE 1.49* 1.37*  CALCIUM 9.8 9.5   Liver Function Tests:  Recent Labs Lab 06/12/14 1331  AST 113*  ALT 75*  ALKPHOS 36*  BILITOT 0.7  PROT 6.2  ALBUMIN 2.5*   No results for input(s): LIPASE, AMYLASE in the last 168 hours. No results for input(s): AMMONIA in the last 168 hours. CBC:  Recent Labs Lab 06/12/14 1331 06/13/14 1010  WBC 4.4 4.3  HGB 11.8* 12.4*  HCT 37.6* 39.2  MCV 93.8 93.3  PLT 141* 154   Cardiac Enzymes: No results for input(s): CKTOTAL, CKMB, CKMBINDEX, TROPONINI in the last 168 hours. BNP (last 3 results) No results for input(s): BNP in the last 8760 hours.  ProBNP (last 3 results) No results for input(s): PROBNP in the last 8760 hours.  CBG: No results for input(s): GLUCAP in the last 168 hours.  Micro No results found for this or any previous visit (from the past 240 hour(s)).   Studies: Mr [name] Contrast  06/13/2014  CLINICAL DATA:  Progressive generalized weakness with diplopia, dysphagia, and bilateral ptosis. Suspected myasthenia gravis. Initial evaluation.  EXAM: MRI HEAD WITHOUT CONTRAST  TECHNIQUE: Multiplanar, multiecho pulse sequences of the brain and surrounding structures were obtained without intravenous contrast.  COMPARISON:   None.  FINDINGS: Diffuse prominence of the CSF containing spaces is compatible with generalized cerebral atrophy. No significant white matter changes present. No chronic infarction.  No abnormal foci of restricted diffusion to suggest acute intracranial infarct. Gray-white matter differentiation maintained. Normal intravascular flow voids present. No acute or chronic intracranial hemorrhage.  No mass lesion or midline shift. No hydrocephalus. No extra-axial fluid collection.  Craniocervical junction normal. Pituitary gland within normal limits. No acute abnormality seen about the orbits.  Paranasal sinuses and mastoid air cells are clear.  Mild degenerative changes noted within the upper cervical spine. Bone marrow signal intensity normal. Scalp soft tissues within normal limits.  IMPRESSION: 1. No acute intracranial abnormality. 2. Moderate generalized cerebral atrophy.   Electronically Signed   By: [name] M.D.   On: 06/13/2014 00:49   Dg Chest Port 1 View  06/12/2014   CLINICAL DATA:  Cough. Questionable aspiration. Generalized weakness.  EXAM: PORTABLE CHEST - 1 VIEW  COMPARISON:  Chest CT, 12/17/2013.  FINDINGS: The heart size and mediastinal contours are within normal limits. Both lungs are clear. The visualized skeletal structures are unremarkable.  IMPRESSION: No active disease.   Electronically Signed   By: [name] M.D.   On: 06/12/2014 16:01   Dg Swallowing Func-speech Pathology  06/13/2014    Objective Swallowing Evaluation:    Patient Details  Name: [name] MRN: [id number] Date of Birth: [birth year]  Today's Date: 06/13/2014 Time: SLP Start Time (ACUTE ONLY): 0815-SLP Stop Time (ACUTE ONLY): 0850 SLP Time Calculation (min) (ACUTE ONLY): 35 min  Past Medical History:  Past Medical History  Diagnosis Date  . Hypertension   . Carotid artery disease   . Weight loss, unintentional   . Protein-calorie malnutrition, moderate    Past Surgical History:  Past Surgical History   Procedure Laterality Date  . Hernia repair      Bilateral  . Prostate biopsy      WNL  . Hemorrhoid surgery    . Cataract extraction     HPI:  HPI: [name] is a 81 y.o. male with a past medical history of  hypertension, gastroesophageal reflux disease, presenting as a transfer  from his neurologist office. Patient having progressive generalized  weakness that started 6 months to 1 year ago, that has been associated  with diplopia, dysphasia, and bilateral ptosis. Patient states that  symptoms occurring throughout the day however on occasion have been worse  at nighttime. He reports having multiple falls in in the past with his  last fall being one month ago. He also reports a 20 pound weight loss  throughout this period, attributing this to minimal by mouth intake  stating he cannot have solid foods. He was evaluated by his neurologist on  day of admit felt that symptoms likely secondary to myasthenia gravis.   MRI brain and CXR negative.    No Data Recorded  Assessment / Plan / Recommendation CHL IP CLINICAL IMPRESSIONS 06/13/2014  Dysphagia Diagnosis Moderate oral phase dysphagia;Severe pharyngeal phase  dysphagia;Severe cervical esophageal phase dysphagia  Clinical impression Moderate oral and severe pharyngo-cervical esophageal  dysphagia with significant weak muscular contraction.  Said weakness  results in severe pharyngeal stasis across all consistencies that mix with  secretions retained.  Liquid swallows faciliated clearance but resulted in  mild aspiration of thin with sensation.  Pt is able to expectorate to  clear vallecular residuals he cannot clear into esophagus.  Pt  expectorated viscous secretions throughout entire evaluation-retained  secretions he is unable to swallow.   Pt is a high aspiration risk with  all intake but apparently has been tolerating given his negative CXR.  He  does report issues with excessive secretions for the last few months.  MD  may desire to allow pt to continue po  intake.  Concern for pt to be able  to support self nutritionally at this time due to level of dysphagia.        CHL IP TREATMENT RECOMMENDATION 06/13/2014  Treatment Plan Recommendations Therapy as outlined in treatment plan below      CHL IP DIET RECOMMENDATION 06/13/2014  Diet Recommendations Ice chips PRN after oral care;Dysphagia 2 (Fine  chop);Thin liquid  Liquid Administration via Cup;Straw  Medication Administration Crushed with puree  Compensations Slow rate;Small sips/bites;Multiple dry swallows after each  bite/sip;Follow solids with liquid;Effortful swallow;Hard cough after  swallow  Postural Changes and/or Swallow Maneuvers Seated upright 90  degrees;Upright 30-60 min after meal     CHL IP OTHER RECOMMENDATIONS 06/13/2014  Recommended Consults (None)  Oral Care Recommendations Oral care BID  Other Recommendations (None)     CHL IP FOLLOW UP RECOMMENDATIONS 06/13/2014  Follow up Recommendations None     CHL IP FREQUENCY AND DURATION 06/13/2014  Speech Therapy Frequency (ACUTE ONLY) (None)  Treatment Duration 2 weeks      CHL IP REASON FOR REFERRAL 06/13/2014  Reason for Referral Objectively evaluate swallowing function     CHL IP ORAL PHASE 06/13/2014  Lips (None)  Tongue (None)  Mucous membranes (None)  Nutritional status (None)  Other (None)  Oxygen therapy (None)  Oral Phase Impaired  Oral - Pudding Teaspoon (None)  Oral - Pudding Cup (None)  Oral - Honey Teaspoon (None)  Oral - Honey Cup (None)  Oral - Honey Syringe (None)  Oral - Nectar Teaspoon (None)  Oral - Nectar Cup Weak lingual manipulation;Delayed oral transit;Reduced  posterior propulsion;Piecemeal swallowing  Oral - Nectar Straw (None)  Oral - Nectar Syringe (None)  Oral - Ice Chips (None)  Oral - Thin Teaspoon Weak lingual manipulation;Delayed oral  transit;Reduced posterior propulsion;Piecemeal swallowing  Oral - Thin Cup Weak lingual manipulation;Delayed oral transit;Reduced  posterior propulsion;Piecemeal swallowing  Oral - Thin Straw (None)   Oral - Thin Syringe (None)  Oral - Puree Weak lingual manipulation;Delayed oral transit;Reduced  posterior propulsion  Oral - Mechanical Soft Weak lingual manipulation;Delayed oral  transit;Reduced posterior propulsion  Oral - Regular (None)  Oral - Multi-consistency (None)  Oral - Pill (None)  Oral Phase - Comment (None)      CHL IP PHARYNGEAL PHASE 06/13/2014  Pharyngeal Phase Impaired  Pharyngeal - Pudding Teaspoon (None)  Penetration/Aspiration details (pudding teaspoon) (None)  Pharyngeal - Pudding Cup (None)  Penetration/Aspiration details (pudding cup) (None)  Pharyngeal - Honey Teaspoon (None)  Penetration/Aspiration details (honey teaspoon) (None)  Pharyngeal - Honey Cup (None)  Penetration/Aspiration details (honey cup) (None)  Pharyngeal - Honey Syringe (None)  Penetration/Aspiration details (honey syringe) (None)  Pharyngeal - Nectar Teaspoon (None)  Penetration/Aspiration details (nectar teaspoon) (None)  Pharyngeal - Nectar Cup Reduced pharyngeal peristalsis;Reduced epiglottic  inversion;Reduced tongue base retraction;Reduced airway/laryngeal  closure;Reduced laryngeal elevation;Pharyngeal residue -  valleculae;Pharyngeal residue - pyriform sinuses;Reduced anterior  laryngeal mobility  Penetration/Aspiration details (nectar cup) (  None)  Pharyngeal - Nectar Straw (None)  Penetration/Aspiration details (nectar straw) (None)  Pharyngeal - Nectar Syringe (None)  Penetration/Aspiration details (nectar syringe) (None)  Pharyngeal - Ice Chips (None)  Penetration/Aspiration details (ice chips) (None)  Pharyngeal - Thin Teaspoon Reduced pharyngeal peristalsis;Reduced  epiglottic inversion;Reduced tongue base retraction;Reduced  airway/laryngeal closure;Reduced laryngeal  elevation;Penetration/Aspiration during swallow;Pharyngeal residue -  valleculae;Pharyngeal residue - pyriform sinuses;Reduced anterior  laryngeal mobility  Penetration/Aspiration details (thin teaspoon) Material enters airway,  remains ABOVE  vocal cords and not ejected out  Pharyngeal - Thin Cup Reduced pharyngeal peristalsis;Reduced epiglottic  inversion;Pharyngeal residue - valleculae;Pharyngeal residue - pyriform  sinuses;Trace aspiration;Penetration/Aspiration during  swallow;Penetration/Aspiration after swallow;Reduced anterior laryngeal  mobility  Penetration/Aspiration details (thin cup) Material enters airway, passes  BELOW cords and not ejected out despite cough attempt by patient  Pharyngeal - Thin Straw (None)  Penetration/Aspiration details (thin straw) (None)  Pharyngeal - Thin Syringe (None)  Penetration/Aspiration details (thin syringe') (None)  Pharyngeal - Puree Reduced pharyngeal peristalsis;Reduced epiglottic  inversion;Pharyngeal residue - valleculae;Reduced tongue base  retraction;Reduced airway/laryngeal closure;Reduced laryngeal  elevation;Reduced anterior laryngeal mobility  Penetration/Aspiration details (puree) (None)  Pharyngeal - Mechanical Soft Reduced pharyngeal peristalsis;Reduced  epiglottic inversion;Pharyngeal residue - valleculae;Reduced laryngeal  elevation;Reduced airway/laryngeal closure;Reduced tongue base  retraction;Reduced anterior laryngeal mobility  Penetration/Aspiration details (mechanical soft) (None)  Pharyngeal - Regular (None)  Penetration/Aspiration details (regular) (None)  Pharyngeal - Multi-consistency (None)  Penetration/Aspiration details (multi-consistency) (None)  Pharyngeal - Pill (None)  Penetration/Aspiration details (pill) (None)  Pharyngeal Comment pharyngeal residuals *(mixing with viscous secretions)  across consistencies noted with pt awareness - liquid swallows faciliated  clearance but resulted in mild aspiration, pt is able to expectorate to  clear vallecular residuals he does not swallow     CHL IP CERVICAL ESOPHAGEAL PHASE 06/13/2014  Cervical Esophageal Phase Impaired                    Nectar Cup Reduced cricopharyngeal relaxation        Thin Teaspoon Reduced cricopharyngeal  relaxation                                                                                                          Mills Koller, MS [facility] SLP [phone]     Scheduled Meds: . benazepril  20 mg Oral Daily  . feeding supplement (ENSURE COMPLETE)  237 mL Oral BID BM  . finasteride  5 mg Oral Daily  . heparin  5,000 Units Subcutaneous 3 times per day  . Immune Globulin 5%  400 mg/kg Intravenous Q24 Hr x 5  . pyridostigmine  60 mg Oral 3 times per day  . sodium chloride  1,000 mL Intravenous Once  . sodium chloride  3 mL Intravenous Q12H   Continuous Infusions: . sodium chloride 75 mL/hr at 06/13/14 0154       Time spent: 35 minutes    [facility] A  [facility] Pager [phone] If 7PM-7AM, please contact night-coverage at [email or web address], password [facility] 06/13/2014, 11:07 AM  LOS: 1 day

## 2014-06-14 DIAGNOSIS — N400 Enlarged prostate without lower urinary tract symptoms: Secondary | ICD-10-CM

## 2014-06-14 LAB — CBC
HCT: 40.3 % (ref 39.0–52.0)
Hemoglobin: 12.7 g/dL — ABNORMAL LOW (ref 13.0–17.0)
MCH: 29.8 pg (ref 26.0–34.0)
MCHC: 31.5 g/dL (ref 30.0–36.0)
MCV: 94.6 fL (ref 78.0–100.0)
Platelets: 150 10*3/uL (ref 150–400)
RBC: 4.26 MIL/uL (ref 4.22–5.81)
RDW: 16.5 % — ABNORMAL HIGH (ref 11.5–15.5)
WBC: 4.9 10*3/uL (ref 4.0–10.5)

## 2014-06-14 LAB — BASIC METABOLIC PANEL
ANION GAP: 3 — AB (ref 5–15)
BUN: 24 mg/dL — AB (ref 6–23)
CHLORIDE: 108 mmol/L (ref 96–112)
CO2: 26 mmol/L (ref 19–32)
CREATININE: 1.3 mg/dL (ref 0.50–1.35)
Calcium: 9.3 mg/dL (ref 8.4–10.5)
GFR, EST AFRICAN AMERICAN: 57 mL/min — AB (ref >90)
GFR, EST NON AFRICAN AMERICAN: 49 mL/min — AB (ref >90)
Glucose, Bld: 122 mg/dL — ABNORMAL HIGH (ref 70–99)
POTASSIUM: 4.1 mmol/L (ref 3.5–5.1)
SODIUM: 137 mmol/L (ref 135–145)

## 2014-06-14 NOTE — Progress Notes (Signed)
PATIENT DETAILS Name: [name] Age: 81 y.o. Sex: male Date of Birth: Feb 27, 1929 Admit Date: 06/12/2014 Admitting Physician Jeralyn Bennett, MD ZOX:[id number], MD  Subjective: No major issues overnight.  Assessment/Plan: Active Problems:   Suspected Myasthenia gravis: Patient presented with worsening weakness along with intermittent diplopia, ptosis and dysphagia to outpatient neurology, subsequently referred to the hospitalist service. Seen by neuro hospitalist team, and started on IV Ig and Mestinon. MRI brain negative, acetylcholine receptor antibodies and striated muscle antibodies negative. Await further recommendations from neurology, defer further care to neurology.    Dysphagia: Seen by speech therapy, current recommendations are dysphagia to liquids. Cautiously continue.    Dehydration: Significantly improved with IV fluids.    Hypertension: Continue benazepril. Blood pressure currently moderately controlled. Follow BP trend for another 24 hours and adjust accordingly.    Severe protein calorie malnutrition: Continue supplements    BPH: Continue Proscar  Disposition: Remain inpatient  Antibiotics:  See below   Anti-infectives    None      DVT Prophylaxis: Prophylactic Heparin   Code Status: Full code   Family Communication None  Procedures:  None  CONSULTS:  neurology  Time spent 40 minutes-which includes 50% of the time with face-to-face with patient/ family and coordinating care related to the above assessment and plan.  MEDICATIONS: Scheduled Meds: . benazepril  20 mg Oral Daily  . feeding supplement (ENSURE COMPLETE)  237 mL Oral BID BM  . finasteride  5 mg Oral Daily  . heparin  5,000 Units Subcutaneous 3 times per day  . Immune Globulin 5%  400 mg/kg Intravenous Q24 Hr x 5  . pyridostigmine  60 mg Oral 3 times per day  . sodium chloride  1,000 mL Intravenous Once  . sodium chloride  3 mL Intravenous Q12H    Continuous Infusions: . sodium chloride 75 mL/hr at 06/14/14 0932   PRN Meds:.acetaminophen **OR** acetaminophen, alum & mag hydroxide-simeth, ondansetron **OR** ondansetron (ZOFRAN) IV, oxyCODONE    PHYSICAL EXAM: Vital signs in last 24 hours: Filed Vitals:   06/13/14 1948 06/13/14 2053 06/13/14 2140 06/14/14 0509  BP: 160/63 142/66 143/58 160/68  Pulse:  86  67  Temp: 98.3 F (36.8 C) 98.7 F (37.1 C) 98.2 F (36.8 C) 97.7 F (36.5 C)  TempSrc: Oral Oral Oral Oral  Resp: Height:      Weight:      SpO2: 96% 97% 96% 97%    Weight change:  Filed Weights   06/12/14 1223  Weight: 59 kg (130 lb 1.1 oz)   Body mass index is 19.2 kg/(m^2).   Gen Exam: Awake and alert with clear speech.  Left eye ptosis Neck: Supple, No JVD.   Chest: B/L Clear.   CVS: S1 S2 Regular, no murmurs.  Abdomen: soft, BS +, non tender, non distended.  Extremities: no edema, lower extremities warm to touch. Neurologic: Non Focal.  With generalized weakness. Skin: No Rash.   Wounds: N/A.   Intake/Output from previous day:  Intake/Output Summary (Last 24 hours) at 06/14/14 1220 Last data filed at 06/14/14 1033  Gross per 24 hour  Intake 2911.25 ml  Output    450 ml  Net 2461.25 ml     LAB RESULTS: CBC  Recent Labs Lab 06/12/14 1331 06/13/14 1010 06/14/14 0550  WBC 4.4 4.3 4.9  HGB 11.8* 12.4* 12.7*  HCT 37.6* 39.2 40.3  PLT 141* 154 150  MCV 93.8 93.3  94.6  MCH 29.4 29.5 29.8  MCHC 31.4 31.6 31.5  RDW 16.1* 16.2* 16.5*    Chemistries   Recent Labs Lab 06/12/14 1331 06/13/14 1010 06/14/14 0550  NA 139 139 137  K 4.0 4.0 4.1  CL 105 107 108  CO2 GLUCOSE 93 132* 122*  BUN 33* 29* 24*  CREATININE 1.49* 1.37* 1.30  CALCIUM 9.8 9.5 9.3    CBG: No results for input(s): GLUCAP in the last 168 hours.  GFR Estimated Creatinine Clearance: 35.9 mL/min (by C-G formula based on Cr of 1.3).  Coagulation profile  Recent Labs Lab 06/12/14 1331   INR 1.17    Cardiac Enzymes No results for input(s): CKMB, TROPONINI, MYOGLOBIN in the last 168 hours.  Invalid input(s): CK  Invalid input(s): POCBNP No results for input(s): DDIMER in the last 72 hours. No results for input(s): HGBA1C in the last 72 hours. No results for input(s): CHOL, HDL, LDLCALC, TRIG, CHOLHDL, LDLDIRECT in the last 72 hours.  Recent Labs  06/12/14 1420  TSH 3.914   No results for input(s): VITAMINB12, FOLATE, FERRITIN, TIBC, IRON, RETICCTPCT in the last 72 hours. No results for input(s): LIPASE, AMYLASE in the last 72 hours.  Urine Studies No results for input(s): UHGB, CRYS in the last 72 hours.  Invalid input(s): UACOL, UAPR, USPG, UPH, UTP, UGL, UKET, UBIL, UNIT, UROB, ULEU, UEPI, UWBC, URBC, UBAC, CAST, UCOM, BILUA  MICROBIOLOGY: No results found for this or any previous visit (from the past 240 hour(s)).  RADIOLOGY STUDIES/RESULTS: Mr [name] Contrast  06/13/2014   CLINICAL DATA:  Progressive generalized weakness with diplopia, dysphagia, and bilateral ptosis. Suspected myasthenia gravis. Initial evaluation.  EXAM: MRI HEAD WITHOUT CONTRAST  TECHNIQUE: Multiplanar, multiecho pulse sequences of the brain and surrounding structures were obtained without intravenous contrast.  COMPARISON:  None.  FINDINGS: Diffuse prominence of the CSF containing spaces is compatible with generalized cerebral atrophy. No significant white matter changes present. No chronic infarction.  No abnormal foci of restricted diffusion to suggest acute intracranial infarct. Gray-white matter differentiation maintained. Normal intravascular flow voids present. No acute or chronic intracranial hemorrhage.  No mass lesion or midline shift. No hydrocephalus. No extra-axial fluid collection.  Craniocervical junction normal. Pituitary gland within normal limits. No acute abnormality seen about the orbits.  Paranasal sinuses and mastoid air cells are clear.  Mild degenerative changes noted  within the upper cervical spine. Bone marrow signal intensity normal. Scalp soft tissues within normal limits.  IMPRESSION: 1. No acute intracranial abnormality. 2. Moderate generalized cerebral atrophy.   Electronically Signed   By: Rise Mu M.D.   On: 06/13/2014 00:49   Dg Chest Port 1 View  06/12/2014   CLINICAL DATA:  Cough. Questionable aspiration. Generalized weakness.  EXAM: PORTABLE CHEST - 1 VIEW  COMPARISON:  Chest CT, 12/17/2013.  FINDINGS: The heart size and mediastinal contours are within normal limits. Both lungs are clear. The visualized skeletal structures are unremarkable.  IMPRESSION: No active disease.   Electronically Signed   By: Amie Portland M.D.   On: 06/12/2014 16:01   Dg Swallowing Func-speech Pathology  06/13/2014    Objective Swallowing Evaluation:    Patient Details  Name: SKIP LITKE MRN: [id number] Date of Birth: Nov 17, 1928  Today's Date: 06/13/2014 Time: SLP Start Time (ACUTE ONLY): 0815-SLP Stop Time (ACUTE ONLY): 0850 SLP Time Calculation (min) (ACUTE ONLY): 35 min  Past Medical History:  Past Medical History  Diagnosis Date  . Hypertension   .  Carotid artery disease   . Weight loss, unintentional   . Protein-calorie malnutrition, moderate    Past Surgical History:  Past Surgical History  Procedure Laterality Date  . Hernia repair      Bilateral  . Prostate biopsy      WNL  . Hemorrhoid surgery    . Cataract extraction     HPI:  HPI: Adrian ProwsBilly W Laural BenesJohnson is a 81 y.o. male with a past medical history of  hypertension, gastroesophageal reflux disease, presenting as a transfer  from his neurologist office. Patient having progressive generalized  weakness that started 6 months to 1 year ago, that has been associated  with diplopia, dysphasia, and bilateral ptosis. Patient states that  symptoms occurring throughout the day however on occasion have been worse  at nighttime. He reports having multiple falls in in the past with his  last fall being one month ago. He also  reports a 20 pound weight loss  throughout this period, attributing this to minimal by mouth intake  stating he cannot have solid foods. He was evaluated by his neurologist on  day of admit felt that symptoms likely secondary to myasthenia gravis.   MRI brain and CXR negative.    No Data Recorded  Assessment / Plan / Recommendation CHL IP CLINICAL IMPRESSIONS 06/13/2014  Dysphagia Diagnosis Moderate oral phase dysphagia;Severe pharyngeal phase  dysphagia;Severe cervical esophageal phase dysphagia  Clinical impression Moderate oral and severe pharyngo-cervical esophageal  dysphagia with significant weak muscular contraction.  Said weakness  results in severe pharyngeal stasis across all consistencies that mix with  secretions retained.  Liquid swallows faciliated clearance but resulted in  mild aspiration of thin with sensation.  Pt is able to expectorate to  clear vallecular residuals he cannot clear into esophagus.  Pt  expectorated viscous secretions throughout entire evaluation-retained  secretions he is unable to swallow.   Pt is a high aspiration risk with  all intake but apparently has been tolerating given his negative CXR.  He  does report issues with excessive secretions for the last few months.  MD  may desire to allow pt to continue po intake.  Concern for pt to be able  to support self nutritionally at this time due to level of dysphagia.        CHL IP TREATMENT RECOMMENDATION 06/13/2014  Treatment Plan Recommendations Therapy as outlined in treatment plan below      CHL IP DIET RECOMMENDATION 06/13/2014  Diet Recommendations Ice chips PRN after oral care;Dysphagia 2 (Fine  chop);Thin liquid  Liquid Administration via Cup;Straw  Medication Administration Crushed with puree  Compensations Slow rate;Small sips/bites;Multiple dry swallows after each  bite/sip;Follow solids with liquid;Effortful swallow;Hard cough after  swallow  Postural Changes and/or Swallow Maneuvers Seated upright 90  degrees;Upright 30-60  min after meal     CHL IP OTHER RECOMMENDATIONS 06/13/2014  Recommended Consults (None)  Oral Care Recommendations Oral care BID  Other Recommendations (None)     CHL IP FOLLOW UP RECOMMENDATIONS 06/13/2014  Follow up Recommendations None     CHL IP FREQUENCY AND DURATION 06/13/2014  Speech Therapy Frequency (ACUTE ONLY) (None)  Treatment Duration 2 weeks      CHL IP REASON FOR REFERRAL 06/13/2014  Reason for Referral Objectively evaluate swallowing function     CHL IP ORAL PHASE 06/13/2014  Lips (None)  Tongue (None)  Mucous membranes (None)  Nutritional status (None)  Other (None)  Oxygen therapy (None)  Oral Phase Impaired  Oral - Pudding Teaspoon (  None)  Oral - Pudding Cup (None)  Oral - Honey Teaspoon (None)  Oral - Honey Cup (None)  Oral - Honey Syringe (None)  Oral - Nectar Teaspoon (None)  Oral - Nectar Cup Weak lingual manipulation;Delayed oral transit;Reduced  posterior propulsion;Piecemeal swallowing  Oral - Nectar Straw (None)  Oral - Nectar Syringe (None)  Oral - Ice Chips (None)  Oral - Thin Teaspoon Weak lingual manipulation;Delayed oral  transit;Reduced posterior propulsion;Piecemeal swallowing  Oral - Thin Cup Weak lingual manipulation;Delayed oral transit;Reduced  posterior propulsion;Piecemeal swallowing  Oral - Thin Straw (None)  Oral - Thin Syringe (None)  Oral - Puree Weak lingual manipulation;Delayed oral transit;Reduced  posterior propulsion  Oral - Mechanical Soft Weak lingual manipulation;Delayed oral  transit;Reduced posterior propulsion  Oral - Regular (None)  Oral - Multi-consistency (None)  Oral - Pill (None)  Oral Phase - Comment (None)      CHL IP PHARYNGEAL PHASE 06/13/2014  Pharyngeal Phase Impaired  Pharyngeal - Pudding Teaspoon (None)  Penetration/Aspiration details (pudding teaspoon) (None)  Pharyngeal - Pudding Cup (None)  Penetration/Aspiration details (pudding cup) (None)  Pharyngeal - Honey Teaspoon (None)  Penetration/Aspiration details (honey teaspoon) (None)  Pharyngeal - Honey  Cup (None)  Penetration/Aspiration details (honey cup) (None)  Pharyngeal - Honey Syringe (None)  Penetration/Aspiration details (honey syringe) (None)  Pharyngeal - Nectar Teaspoon (None)  Penetration/Aspiration details (nectar teaspoon) (None)  Pharyngeal - Nectar Cup Reduced pharyngeal peristalsis;Reduced epiglottic  inversion;Reduced tongue base retraction;Reduced airway/laryngeal  closure;Reduced laryngeal elevation;Pharyngeal residue -  valleculae;Pharyngeal residue - pyriform sinuses;Reduced anterior  laryngeal mobility  Penetration/Aspiration details (nectar cup) (None)  Pharyngeal - Nectar Straw (None)  Penetration/Aspiration details (nectar straw) (None)  Pharyngeal - Nectar Syringe (None)  Penetration/Aspiration details (nectar syringe) (None)  Pharyngeal - Ice Chips (None)  Penetration/Aspiration details (ice chips) (None)  Pharyngeal - Thin Teaspoon Reduced pharyngeal peristalsis;Reduced  epiglottic inversion;Reduced tongue base retraction;Reduced  airway/laryngeal closure;Reduced laryngeal  elevation;Penetration/Aspiration during swallow;Pharyngeal residue -  valleculae;Pharyngeal residue - pyriform sinuses;Reduced anterior  laryngeal mobility  Penetration/Aspiration details (thin teaspoon) Material enters airway,  remains ABOVE vocal cords and not ejected out  Pharyngeal - Thin Cup Reduced pharyngeal peristalsis;Reduced epiglottic  inversion;Pharyngeal residue - valleculae;Pharyngeal residue - pyriform  sinuses;Trace aspiration;Penetration/Aspiration during  swallow;Penetration/Aspiration after swallow;Reduced anterior laryngeal  mobility  Penetration/Aspiration details (thin cup) Material enters airway, passes  BELOW cords and not ejected out despite cough attempt by patient  Pharyngeal - Thin Straw (None)  Penetration/Aspiration details (thin straw) (None)  Pharyngeal - Thin Syringe (None)  Penetration/Aspiration details (thin syringe') (None)  Pharyngeal - Puree Reduced pharyngeal  peristalsis;Reduced epiglottic  inversion;Pharyngeal residue - valleculae;Reduced tongue base  retraction;Reduced airway/laryngeal closure;Reduced laryngeal  elevation;Reduced anterior laryngeal mobility  Penetration/Aspiration details (puree) (None)  Pharyngeal - Mechanical Soft Reduced pharyngeal peristalsis;Reduced  epiglottic inversion;Pharyngeal residue - valleculae;Reduced laryngeal  elevation;Reduced airway/laryngeal closure;Reduced tongue base  retraction;Reduced anterior laryngeal mobility  Penetration/Aspiration details (mechanical soft) (None)  Pharyngeal - Regular (None)  Penetration/Aspiration details (regular) (None)  Pharyngeal - Multi-consistency (None)  Penetration/Aspiration details (multi-consistency) (None)  Pharyngeal - Pill (None)  Penetration/Aspiration details (pill) (None)  Pharyngeal Comment pharyngeal residuals *(mixing with viscous secretions)  across consistencies noted with pt awareness - liquid swallows faciliated  clearance but resulted in mild aspiration, pt is able to expectorate to  clear vallecular residuals he does not swallow     CHL IP CERVICAL ESOPHAGEAL PHASE 06/13/2014  Cervical Esophageal Phase Impaired  Nectar Cup Reduced cricopharyngeal relaxation        Thin Teaspoon Reduced cricopharyngeal relaxation                                                                                                          Mills Koller, MS [facility] SLP [phone]     Jeoffrey Massed, MD  [facility] Pager:[phone]  If 7PM-7AM, please contact night-coverage [email or web address] Password [email or web address] 06/14/2014, 12:20 PM   LOS: 2 days

## 2014-06-14 NOTE — Progress Notes (Addendum)
Subjective: Patient feels he is improved slightly since yesterday. He had no new complaints. No diplopia noted today.  Objective: Current vital signs: BP 160/68 mmHg  Pulse 67  Temp(Src) 97.7 F (36.5 C) (Oral)  Resp 20  Ht 5\' 9"  (1.753 m)  Wt 59 kg (130 lb 1.1 oz)  BMI 19.20 kg/m2  SpO2 97%  Neurologic Exam: Patient was alert and in no acute distress. Mental status was normal. Extraocular movements were full and conjugate. Bilateral eyelid ptosis was noted, left greater than right, and unchanged. Speech improved with increased volume as well as lower pitch, compared to yesterday. Neck flexor strength was 3 over 5, and extensors were normal.  Upper extremities were normal except for 4/5 strength of deltoids. Hip flexor strength was 4/5 bilaterally.  Medications: I have reviewed the patient's current medications.  Assessment/Plan: 81 year old man presenting with myasthenia gravis with slight improvement in speech and he is no longer experiencing diplopia. He is due today for his second of 5 treatments with IVIG. It's unclear so far if swallowing has improved.  Recommend no changes in current management with loss of IVIG as planned, as well as PT, OT and SLP interventions.  C.R. Roseanne RenoStewart, MD [facility] [phone]  06/14/2014  12:38 PM

## 2014-06-14 NOTE — Progress Notes (Signed)
Pt performed NIF -60 and VC 2.0L. Patient shows good effort with a productive cough.

## 2014-06-15 NOTE — Progress Notes (Signed)
NIF > -60 VC 2.0 with good patient effort

## 2014-06-15 NOTE — Progress Notes (Signed)
RT Note:  NIF -38  VC  1.32L  Good patient effort.

## 2014-06-15 NOTE — Progress Notes (Signed)
Subjective: Patient had no new complaints other than abdominal pain. He said no problems with nausea. He has not had problems with loose stools or diarrhea. He has not experienced diplopia today.  Objective: Current vital signs: BP 135/74 mmHg  Pulse 73  Temp(Src) 98.3 F (36.8 C) (Oral)  Resp 14  Ht 5\' 9"  (1.753 m)  Wt 59 kg (130 lb 1.1 oz)  BMI 19.20 kg/m2  SpO2 96%  Neurologic Exam: Patient was alert and in no acute distress. Mental status was normal. Extraocular movements were full and conjugate. Ptosis was present bilaterally, mild on the right and moderate on the left. No facial weakness was noted except for minimal ecchymosis oculi weakness. Neck flexor strength was 3/5, and extensors were 5/5. Upper extremity exam was unchanged with bilateral 4/5 deltoid weakness. Biceps and triceps were normal as were intrinsic hand muscles. Lower extremities was unchanged with 4/5 strength of flexors bilaterally.  Medications: I have reviewed the patient's current medications.  Assessment/Plan: 81 year old man admitted for management of exacerbation of myasthenia gravis. He has received 3 of 5 planned doses of IVIG with only slight improvement. Antibodies for striated muscle and binding Ach receptor antibodies were negative. Remainder of myasthenia gravis panel is pending. Patient may need a course of plasmapheresis since he has not responded well to IVIG. I did not want to chance acute worsening with high-dose steroid treatment. He has to IVIG treatments remaining.  Physical therapy, occupational therapy and SLP intervention to continue. Patient will likely need inpatient rehabilitation admission following his acute care stay.  C.R. Roseanne RenoStewart, MD [facility] [phone]  06/15/2014  3:36 PM

## 2014-06-15 NOTE — Progress Notes (Signed)
PATIENT DETAILS Name: [name] Age: 81 y.o. Sex: male Date of Birth: Aug 05, 1928 Admit Date: 06/12/2014 Admitting Physician Jeralyn Bennett, MD ZOX:[id number], MD  Subjective: No complaints this morning  Assessment/Plan: Active Problems:   Suspected Myasthenia gravis: Patient presented with worsening weakness along with intermittent diplopia, ptosis and dysphagia to outpatient neurology, subsequently referred to the hospitalist service. Seen by neuro hospitalist team, and started on IV Ig and Mestinon. MRI brain negative, acetylcholine receptor antibodies and striated muscle antibodies negative. Await further recommendations from neurology, defer further care to neurology. CIR consulted.    Dysphagia: Seen by speech therapy, current recommendations are dysphagia to liquids. Cautiously continue.    Dehydration: Resolved with IV fluids.    Hypertension: Continue benazepril. Blood pressure currently moderately controlled. Stop IV fluids today and follow BP trend for another 24 hours and adjust accordingly.    Severe protein calorie malnutrition: Continue supplements    BPH: Continue Proscar  Disposition: Remain inpatient-CIR vs SNF on discharge  Antibiotics:  See below   Anti-infectives    None      DVT Prophylaxis: Prophylactic Heparin   Code Status: Full code   Family Communication None  Procedures:  None  CONSULTS:  neurology  Time spent 40 minutes-which includes 50% of the time with face-to-face with patient/ family and coordinating care related to the above assessment and plan.  MEDICATIONS: Scheduled Meds: . benazepril  20 mg Oral Daily  . feeding supplement (ENSURE COMPLETE)  237 mL Oral BID BM  . finasteride  5 mg Oral Daily  . heparin  5,000 Units Subcutaneous 3 times per day  . Immune Globulin 5%  400 mg/kg Intravenous Q24 Hr x 5  . pyridostigmine  60 mg Oral 3 times per day  . sodium chloride  1,000 mL Intravenous Once  .  sodium chloride  3 mL Intravenous Q12H   Continuous Infusions: . sodium chloride 75 mL/hr at 06/15/14 0233   PRN Meds:.acetaminophen **OR** acetaminophen, alum & mag hydroxide-simeth, ondansetron **OR** ondansetron (ZOFRAN) IV, oxyCODONE    PHYSICAL EXAM: Vital signs in last 24 hours: Filed Vitals:   06/14/14 1611 06/14/14 1635 06/14/14 2128 06/15/14 0540  BP: 153/73 164/73 155/64 167/69  Pulse:   79 69  Temp: 97.5 F (36.4 C) 97.7 F (36.5 C) 97.9 F (36.6 C) 97.4 F (36.3 C)  TempSrc: Oral Oral Oral Oral  Resp:   18 18  Height:      Weight:      SpO2:   97% 96%    Weight change:  Filed Weights   06/12/14 1223  Weight: 59 kg (130 lb 1.1 oz)   Body mass index is 19.2 kg/(m^2).   Gen Exam: Awake and alert with clear speech.  Left eye ptosis. Not in acute distress Neck: Supple, No JVD.   Chest: B/L Clear.   no rales or rhonchi  CVS: S1 S2 Regular, no murmurs.  Abdomen: soft, BS +, non tender, non distended.  Extremities: no edema, lower extremities warm to touch. Neurologic: Non Focal. But with generalized weakness. Skin: No Rash.   Wounds: N/A.   Intake/Output from previous day:  Intake/Output Summary (Last 24 hours) at 06/15/14 0944 Last data filed at 06/15/14 0600  Gross per 24 hour  Intake 1591.25 ml  Output    400 ml  Net 1191.25 ml     LAB RESULTS: CBC  Recent Labs Lab 06/12/14 1331 06/13/14 1010 06/14/14 0550  WBC 4.4 4.3 4.9  HGB 11.8* 12.4* 12.7*  HCT 37.6* 39.2 40.3  PLT 141* 154 150  MCV 93.8 93.3 94.6  MCH 29.4 29.5 29.8  MCHC 31.4 31.6 31.5  RDW 16.1* 16.2* 16.5*    Chemistries   Recent Labs Lab 06/12/14 1331 06/13/14 1010 06/14/14 0550  NA 139 139 137  K 4.0 4.0 4.1  CL 105 107 108  CO2 25 22 26   GLUCOSE 93 132* 122*  BUN 33* 29* 24*  CREATININE 1.49* 1.37* 1.30  CALCIUM 9.8 9.5 9.3    CBG: No results for input(s): GLUCAP in the last 168 hours.  GFR Estimated Creatinine Clearance: 35.9 mL/min (by C-G formula  based on Cr of 1.3).  Coagulation profile  Recent Labs Lab 06/12/14 1331  INR 1.17    Cardiac Enzymes No results for input(s): CKMB, TROPONINI, MYOGLOBIN in the last 168 hours.  Invalid input(s): CK  Invalid input(s): POCBNP No results for input(s): DDIMER in the last 72 hours. No results for input(s): HGBA1C in the last 72 hours. No results for input(s): CHOL, HDL, LDLCALC, TRIG, CHOLHDL, LDLDIRECT in the last 72 hours.  Recent Labs  06/12/14 1420  TSH 3.914   No results for input(s): VITAMINB12, FOLATE, FERRITIN, TIBC, IRON, RETICCTPCT in the last 72 hours. No results for input(s): LIPASE, AMYLASE in the last 72 hours.  Urine Studies No results for input(s): UHGB, CRYS in the last 72 hours.  Invalid input(s): UACOL, UAPR, USPG, UPH, UTP, UGL, UKET, UBIL, UNIT, UROB, ULEU, UEPI, UWBC, URBC, UBAC, CAST, UCOM, BILUA  MICROBIOLOGY: No results found for this or any previous visit (from the past 240 hour(s)).  RADIOLOGY STUDIES/RESULTS: Mr Sherrin DaisyBrain Wo Contrast  06/13/2014   CLINICAL DATA:  Progressive generalized weakness with diplopia, dysphagia, and bilateral ptosis. Suspected myasthenia gravis. Initial evaluation.  EXAM: MRI HEAD WITHOUT CONTRAST  TECHNIQUE: Multiplanar, multiecho pulse sequences of the brain and surrounding structures were obtained without intravenous contrast.  COMPARISON:  None.  FINDINGS: Diffuse prominence of the CSF containing spaces is compatible with generalized cerebral atrophy. No significant white matter changes present. No chronic infarction.  No abnormal foci of restricted diffusion to suggest acute intracranial infarct. Gray-white matter differentiation maintained. Normal intravascular flow voids present. No acute or chronic intracranial hemorrhage.  No mass lesion or midline shift. No hydrocephalus. No extra-axial fluid collection.  Craniocervical junction normal. Pituitary gland within normal limits. No acute abnormality seen about the orbits.   Paranasal sinuses and mastoid air cells are clear.  Mild degenerative changes noted within the upper cervical spine. Bone marrow signal intensity normal. Scalp soft tissues within normal limits.  IMPRESSION: 1. No acute intracranial abnormality. 2. Moderate generalized cerebral atrophy.   Electronically Signed   By: Rise MuBenjamin  McClintock M.D.   On: 06/13/2014 00:49   Dg Chest Port 1 View  06/12/2014   CLINICAL DATA:  Cough. Questionable aspiration. Generalized weakness.  EXAM: PORTABLE CHEST - 1 VIEW  COMPARISON:  Chest CT, 12/17/2013.  FINDINGS: The heart size and mediastinal contours are within normal limits. Both lungs are clear. The visualized skeletal structures are unremarkable.  IMPRESSION: No active disease.   Electronically Signed   By: Amie Portlandavid  Ormond M.D.   On: 06/12/2014 16:01   Dg Swallowing Func-speech Pathology  06/13/2014    Objective Swallowing Evaluation:    Patient Details  Name: Salina AprilBilly W Sirmons MRN: [id number] Date of Birth: [birth year]  Today's Date: 06/13/2014 Time: SLP Start Time (ACUTE ONLY): 0815-SLP Stop Time (ACUTE ONLY): 0850 SLP Time Calculation (min) (ACUTE ONLY):  35 min  Past Medical History:  Past Medical History  Diagnosis Date  . Hypertension   . Carotid artery disease   . Weight loss, unintentional   . Protein-calorie malnutrition, moderate    Past Surgical History:  Past Surgical History  Procedure Laterality Date  . Hernia repair      Bilateral  . Prostate biopsy      WNL  . Hemorrhoid surgery    . Cataract extraction     HPI:  HPI: Ora Mcnatt Porcaro is a 81 y.o. male with a past medical history of  hypertension, gastroesophageal reflux disease, presenting as a transfer  from his neurologist office. Patient having progressive generalized  weakness that started 6 months to 1 year ago, that has been associated  with diplopia, dysphasia, and bilateral ptosis. Patient states that  symptoms occurring throughout the day however on occasion have been worse  at nighttime. He reports having  multiple falls in in the past with his  last fall being one month ago. He also reports a 20 pound weight loss  throughout this period, attributing this to minimal by mouth intake  stating he cannot have solid foods. He was evaluated by his neurologist on  day of admit felt that symptoms likely secondary to myasthenia gravis.   MRI brain and CXR negative.    No Data Recorded  Assessment / Plan / Recommendation CHL IP CLINICAL IMPRESSIONS 06/13/2014  Dysphagia Diagnosis Moderate oral phase dysphagia;Severe pharyngeal phase  dysphagia;Severe cervical esophageal phase dysphagia  Clinical impression Moderate oral and severe pharyngo-cervical esophageal  dysphagia with significant weak muscular contraction.  Said weakness  results in severe pharyngeal stasis across all consistencies that mix with  secretions retained.  Liquid swallows faciliated clearance but resulted in  mild aspiration of thin with sensation.  Pt is able to expectorate to  clear vallecular residuals he cannot clear into esophagus.  Pt  expectorated viscous secretions throughout entire evaluation-retained  secretions he is unable to swallow.   Pt is a high aspiration risk with  all intake but apparently has been tolerating given his negative CXR.  He  does report issues with excessive secretions for the last few months.  MD  may desire to allow pt to continue po intake.  Concern for pt to be able  to support self nutritionally at this time due to level of dysphagia.        CHL IP TREATMENT RECOMMENDATION 06/13/2014  Treatment Plan Recommendations Therapy as outlined in treatment plan below      CHL IP DIET RECOMMENDATION 06/13/2014  Diet Recommendations Ice chips PRN after oral care;Dysphagia 2 (Fine  chop);Thin liquid  Liquid Administration via Cup;Straw  Medication Administration Crushed with puree  Compensations Slow rate;Small sips/bites;Multiple dry swallows after each  bite/sip;Follow solids with liquid;Effortful swallow;Hard cough after  swallow   Postural Changes and/or Swallow Maneuvers Seated upright 90  degrees;Upright 30-60 min after meal     CHL IP OTHER RECOMMENDATIONS 06/13/2014  Recommended Consults (None)  Oral Care Recommendations Oral care BID  Other Recommendations (None)     CHL IP FOLLOW UP RECOMMENDATIONS 06/13/2014  Follow up Recommendations None     CHL IP FREQUENCY AND DURATION 06/13/2014  Speech Therapy Frequency (ACUTE ONLY) (None)  Treatment Duration 2 weeks      CHL IP REASON FOR REFERRAL 06/13/2014  Reason for Referral Objectively evaluate swallowing function     CHL IP ORAL PHASE 06/13/2014  Lips (None)  Tongue (None)  Mucous membranes (None)  Nutritional status (None)  Other (None)  Oxygen therapy (None)  Oral Phase Impaired  Oral - Pudding Teaspoon (None)  Oral - Pudding Cup (None)  Oral - Honey Teaspoon (None)  Oral - Honey Cup (None)  Oral - Honey Syringe (None)  Oral - Nectar Teaspoon (None)  Oral - Nectar Cup Weak lingual manipulation;Delayed oral transit;Reduced  posterior propulsion;Piecemeal swallowing  Oral - Nectar Straw (None)  Oral - Nectar Syringe (None)  Oral - Ice Chips (None)  Oral - Thin Teaspoon Weak lingual manipulation;Delayed oral  transit;Reduced posterior propulsion;Piecemeal swallowing  Oral - Thin Cup Weak lingual manipulation;Delayed oral transit;Reduced  posterior propulsion;Piecemeal swallowing  Oral - Thin Straw (None)  Oral - Thin Syringe (None)  Oral - Puree Weak lingual manipulation;Delayed oral transit;Reduced  posterior propulsion  Oral - Mechanical Soft Weak lingual manipulation;Delayed oral  transit;Reduced posterior propulsion  Oral - Regular (None)  Oral - Multi-consistency (None)  Oral - Pill (None)  Oral Phase - Comment (None)      CHL IP PHARYNGEAL PHASE 06/13/2014  Pharyngeal Phase Impaired  Pharyngeal - Pudding Teaspoon (None)  Penetration/Aspiration details (pudding teaspoon) (None)  Pharyngeal - Pudding Cup (None)  Penetration/Aspiration details (pudding cup) (None)  Pharyngeal - Honey Teaspoon  (None)  Penetration/Aspiration details (honey teaspoon) (None)  Pharyngeal - Honey Cup (None)  Penetration/Aspiration details (honey cup) (None)  Pharyngeal - Honey Syringe (None)  Penetration/Aspiration details (honey syringe) (None)  Pharyngeal - Nectar Teaspoon (None)  Penetration/Aspiration details (nectar teaspoon) (None)  Pharyngeal - Nectar Cup Reduced pharyngeal peristalsis;Reduced epiglottic  inversion;Reduced tongue base retraction;Reduced airway/laryngeal  closure;Reduced laryngeal elevation;Pharyngeal residue -  valleculae;Pharyngeal residue - pyriform sinuses;Reduced anterior  laryngeal mobility  Penetration/Aspiration details (nectar cup) (None)  Pharyngeal - Nectar Straw (None)  Penetration/Aspiration details (nectar straw) (None)  Pharyngeal - Nectar Syringe (None)  Penetration/Aspiration details (nectar syringe) (None)  Pharyngeal - Ice Chips (None)  Penetration/Aspiration details (ice chips) (None)  Pharyngeal - Thin Teaspoon Reduced pharyngeal peristalsis;Reduced  epiglottic inversion;Reduced tongue base retraction;Reduced  airway/laryngeal closure;Reduced laryngeal  elevation;Penetration/Aspiration during swallow;Pharyngeal residue -  valleculae;Pharyngeal residue - pyriform sinuses;Reduced anterior  laryngeal mobility  Penetration/Aspiration details (thin teaspoon) Material enters airway,  remains ABOVE vocal cords and not ejected out  Pharyngeal - Thin Cup Reduced pharyngeal peristalsis;Reduced epiglottic  inversion;Pharyngeal residue - valleculae;Pharyngeal residue - pyriform  sinuses;Trace aspiration;Penetration/Aspiration during  swallow;Penetration/Aspiration after swallow;Reduced anterior laryngeal  mobility  Penetration/Aspiration details (thin cup) Material enters airway, passes  BELOW cords and not ejected out despite cough attempt by patient  Pharyngeal - Thin Straw (None)  Penetration/Aspiration details (thin straw) (None)  Pharyngeal - Thin Syringe (None)  Penetration/Aspiration  details (thin syringe') (None)  Pharyngeal - Puree Reduced pharyngeal peristalsis;Reduced epiglottic  inversion;Pharyngeal residue - valleculae;Reduced tongue base  retraction;Reduced airway/laryngeal closure;Reduced laryngeal  elevation;Reduced anterior laryngeal mobility  Penetration/Aspiration details (puree) (None)  Pharyngeal - Mechanical Soft Reduced pharyngeal peristalsis;Reduced  epiglottic inversion;Pharyngeal residue - valleculae;Reduced laryngeal  elevation;Reduced airway/laryngeal closure;Reduced tongue base  retraction;Reduced anterior laryngeal mobility  Penetration/Aspiration details (mechanical soft) (None)  Pharyngeal - Regular (None)  Penetration/Aspiration details (regular) (None)  Pharyngeal - Multi-consistency (None)  Penetration/Aspiration details (multi-consistency) (None)  Pharyngeal - Pill (None)  Penetration/Aspiration details (pill) (None)  Pharyngeal Comment pharyngeal residuals *(mixing with viscous secretions)  across consistencies noted with pt awareness - liquid swallows faciliated  clearance but resulted in mild aspiration, pt is able to expectorate to  clear vallecular residuals he does not swallow     CHL IP CERVICAL ESOPHAGEAL PHASE 06/13/2014  Cervical Esophageal  Phase Impaired                    Nectar Cup Reduced cricopharyngeal relaxation        Thin Teaspoon Reduced cricopharyngeal relaxation                                                                                                          Mills Koller, MS [facility] SLP [phone]     Jeoffrey Massed, MD  [facility] Pager:[phone]  If 7PM-7AM, please contact night-coverage [email or web address] Password [facility] 06/15/2014, 9:44 AM   LOS: 3 days

## 2014-06-15 NOTE — Progress Notes (Signed)
Speech Language Pathology Treatment: Dysphagia  Patient Details Name: Ronald AprilBilly W Manolis MRN: [id number] DOB: 12-04-1928 Today's Date: 06/15/2014 Time: [id number] SLP Time Calculation (min) (ACUTE ONLY): 11 min  Assessment / Plan / Recommendation Clinical Impression  Pt seen for f/u dysphagia treatment with Min cues provided for utilization of compensatory strategies throughout consumption of Dys 2 textures and thin liquids. Intermittent throat clearing noted, although pt reports feeling like his dysphagia is improving. He remains afebrile and lung sounds are clear per MD note. Recommend to continue current diet and precautions.   HPI HPI: [name] is a 81 y.o. male with a past medical history of hypertension, gastroesophageal reflux disease, presenting as a transfer from his neurologist office. Patient having progressive generalized weakness that started 6 months to 1 year ago, that has been associated with diplopia, dysphasia, and bilateral ptosis. Patient states that symptoms occurring throughout the day however on occasion have been worse at nighttime. He reports having multiple falls in in the past with his last fall being one month ago. He also reports a 20 pound weight loss throughout this period, attributing this to minimal by mouth intake stating he cannot have solid foods. He was evaluated by his neurologist on day of admit felt that symptoms likely secondary to myasthenia gravis.  MRI brain and CXR negative.     Pertinent Vitals Pain Assessment: No/denies pain  SLP Plan  Continue with current plan of care    Recommendations Diet recommendations: Dysphagia 2 (fine chop);Thin liquid Liquids provided via: Cup;No straw Medication Administration: Crushed with puree Supervision: Patient able to self feed Compensations: Slow rate;Small sips/bites;Multiple dry swallows after each bite/sip;Follow solids with liquid;Effortful swallow;Hard cough after swallow (start meal with  liquids) Postural Changes and/or Swallow Maneuvers: Seated upright 90 degrees;Upright 30-60 min after meal (expectorate throughout meal)       Oral Care Recommendations: Oral care BID Follow up Recommendations: None Plan: Continue with current plan of care       Maxcine HamLaura Paiewonsky, M.A. CCC-SLP [phone]  Maxcine Hamaiewonsky, Jameson Tormey 06/15/2014, 10:55 AM

## 2014-06-16 ENCOUNTER — Inpatient Hospital Stay (HOSPITAL_COMMUNITY): Payer: Medicare Other

## 2014-06-16 DIAGNOSIS — Z452 Encounter for adjustment and management of vascular access device: Secondary | ICD-10-CM | POA: Insufficient documentation

## 2014-06-16 MED ORDER — FLEET ENEMA 7-19 GM/118ML RE ENEM
1.0000 | ENEMA | Freq: Every day | RECTAL | Status: DC | PRN
  Administered 2014-06-18: 1 via RECTAL
  Filled 2014-06-16 (×3): qty 1

## 2014-06-16 MED ORDER — POLYETHYLENE GLYCOL 3350 17 G PO PACK
17.0000 g | PACK | Freq: Two times a day (BID) | ORAL | Status: DC
  Administered 2014-06-16 – 2014-06-18 (×5): 17 g via ORAL
  Filled 2014-06-16 (×6): qty 1

## 2014-06-16 MED ORDER — HEPARIN SODIUM (PORCINE) 1000 UNIT/ML IJ SOLN
3000.0000 [IU] | Freq: Once | INTRAMUSCULAR | Status: DC
  Filled 2014-06-16: qty 3

## 2014-06-16 NOTE — Progress Notes (Addendum)
Speech Language Pathology Treatment: Dysphagia  Patient Details Name: [name] MRN: [id number] DOB: 1928/04/27 Today's Date: 06/16/2014 Time: [id number] SLP Time Calculation (min) (ACUTE ONLY): 26 min  Assessment / Plan / Recommendation Clinical Impression  Pt reports continued poor appetite and taste changes.  He also states his abdomen has been aching *sensation of pressure across stomach for the last few days.  Currently pt denies discomfort but states he has not had a bowel movement in a few days.  He further states it would take him approximately one hour to have a complete bowel movement at home *initially starting well.  Breakfast tray near pt with minimal intake - pt contributed to poor appetite.  Pt with much improved management of secretions today compared to during MBS on Friday 06/13/14.  He continues with intermittent throat clearing without intake, which he states has occurred "for years".    Pt consumed coffee and water during SLP visit demonstrating multiple swallows and no overt indications of aspiration.  Reviewed MBS video and provided pt clearer written instructions to review clinical reasoning for compensation strategies with pt benefiting from moderate cues to conduct.  Pt did verbalize "well, I think I already do those" causing this SLP to suspect component of chronic dysphagia.  Note plans for possible plasmapheresis and pt for IVIG x5 *received 3 per notes.    Concern for poor po impacting pt's rehabilitation, ? Could this be multifactorial (dysphagia, gustatory changes, abdomen pain, bowel movement issues).  Note intake listed as only 25%.    Recommend continue diet with aspiration precautions to mitigate risk and follow up SLP to maximize swallow rehabilitation.   Pt agreeable to plan - he does not recall being informed of myasthenia gravis work up - ? Memory difficulties?   HPI HPI: Ronald AprilBilly W [name] is a 81 y.o. male with a past medical history of hypertension,  gastroesophageal reflux disease, presenting as a transfer from his neurologist office. Patient having progressive generalized weakness that started 6 months to 1 year ago, that has been associated with diplopia, dysphasia, and bilateral ptosis. Patient states that symptoms occurring throughout the day however on occasion have been worse at nighttime. He reports having multiple falls in in the past with his last fall being one month ago. He also reports a 20 pound weight loss throughout this period, attributing this to minimal by mouth intake stating he cannot have solid foods. He was evaluated by his neurologist on day of admit felt that symptoms likely secondary to myasthenia gravis.  MRI brain and CXR negative.     Pertinent Vitals Pain Assessment: No/denies pain  SLP Plan  Continue with current plan of care    Recommendations Diet recommendations: Dysphagia 2 (fine chop);Thin liquid Liquids provided via: Cup;No straw Medication Administration: Crushed with puree Supervision: Patient able to self feed Compensations: Slow rate;Small sips/bites;Multiple dry swallows after each bite/sip;Follow solids with liquid;Effortful swallow;Hard cough after swallow (start meal with liquids) Postural Changes and/or Swallow Maneuvers: Seated upright 90 degrees;Upright 30-60 min after meal (expectorate throughout meal)              Oral Care Recommendations: Oral care BID Follow up Recommendations: Inpatient Rehab Plan: Continue with current plan of care    GO     Ronald Burnetamara Adelbert Gaspard, MS [facility] SLP [phone]

## 2014-06-16 NOTE — Procedures (Signed)
Hemodialysis Insertion Procedure Note Salina AprilBilly W Jemmott [id number] [birth year]  Procedure: Insertion of Hemodialysis Catheter Type: 3 port  Indications: Plasma Exchange  Procedure Details Consent: Risks of procedure as well as the alternatives and risks of each were explained to the (patient/caregiver).  Consent for procedure obtained. Time Out: Verified patient identification, verified procedure, site/side was marked, verified correct patient position, special equipment/implants available, medications/allergies/relevent history reviewed, required imaging and test results available.  Performed  Maximum sterile technique was used including antiseptics, cap, gloves, gown, hand hygiene, mask and sheet. Skin prep: Chlorhexidine; local anesthetic administered A antimicrobial bonded/coated triple lumen catheter was placed in the left internal jugular vein using the Seldinger technique. Ultrasound guidance used.Yes.   Catheter placed to 20 cm. Blood aspirated via all 3 ports and then flushed x 3. Line sutured x 2 and dressing applied.  Evaluation Blood flow good Complications: No apparent complications Patient did tolerate procedure well. Chest X-ray ordered to verify placement.  CXR: pending.  Joneen RoachPaul Hoffman, AGACNP-BC [facility] Pager [phone] or [phone]   Levy Pupaobert Gill Delrossi, MD, PhD 06/16/2014, 3:32 PM [facility] [phone] or if no answer [phone]

## 2014-06-16 NOTE — Progress Notes (Signed)
OT Cancellation Note  Patient Details Name: [name] MRN: [id number] DOB: June 16, 1928   Cancelled Treatment:    Reason Eval/Treat Not Completed: Patient at procedure or test/ unavailable  Lashawnta Burgert, [place] KungLorraine D 06/16/2014, 2:47 PM

## 2014-06-16 NOTE — Progress Notes (Signed)
Physical Therapy Treatment Patient Details Name: [name] MRN: [id number] DOB: Jan 13, 1929 Today's Date: 06/16/2014    History of Present Illness Pt is an 81 y.o. Male admitted 06/12/14 from his neurologist office due to progressive generalized weakness and associated diplopia, dysphagia, ptosis. Pt has had multiple falls at home. Neurologist admitted pt due to symptoms likely 2/2 myasthenia gravis. PMH: HTN, GERD. Pt now being treated for myasthenia gravis on IVIG and Mestinon. MRI brain showed no acute findings. MBS by SLP showed dysphagia.     PT Comments    Pt able to increase ambulation distance today with RW. Pt scored in high fall risk category of Berg Balance Assessment.  Cont' to recommend CIR.  Follow Up Recommendations  CIR     Equipment Recommendations  Rolling walker with 5" wheels    Recommendations for Other Services       Precautions / Restrictions Precautions Precautions: Fall Precaution Comments: pt/family report multiple falls at home Restrictions Weight Bearing Restrictions: No    Mobility  Bed Mobility   Bed Mobility: Supine to Sit     Supine to sit: Min guard     General bed mobility comments: Pt able to transfer supine to sit with MIN/guard with heavy use of bed rail.  Transfers Overall transfer level: Needs assistance Equipment used: Rolling walker (2 wheeled) Transfers: Sit to/from Stand Sit to Stand: Min guard         General transfer comment: cues to scoot to edge of sitting surface prior to standing and requires multiple attempts.   Ambulation/Gait Ambulation/Gait assistance: Min guard Ambulation Distance (Feet): 180 Feet Assistive device: Rolling walker (2 wheeled) Gait Pattern/deviations: Step-through pattern;Trunk flexed Gait velocity: decreased   General Gait Details: frequent cueing for posture and to stay within BOS of RW.   Stairs            Wheelchair Mobility    Modified Rankin (Stroke Patients Only)        Balance                                    Cognition Arousal/Alertness: Awake/alert Behavior During Therapy: WFL for tasks assessed/performed;Flat affect Overall Cognitive Status: Within Functional Limits for tasks assessed                      Exercises      General Comments        Pertinent Vitals/Pain Pain Assessment: No/denies pain    Home Living                      Prior Function            PT Goals (current goals can now be found in the care plan section) Acute Rehab PT Goals PT Goal Formulation: With patient Time For Goal Achievement: 06/26/14 Potential to Achieve Goals: Good Progress towards PT goals: Progressing toward goals    Frequency  Min 3X/week    PT Plan Current plan remains appropriate    Co-evaluation             End of Session Equipment Utilized During Treatment: Gait belt Activity Tolerance: Patient tolerated treatment well Patient left: in chair;with family/visitor present;with call bell/phone within reach;with chair alarm set     Time: 4098-1191 PT Time Calculation (min) (ACUTE ONLY): 24 min  Charges:  $Gait Training: 8-22 mins $Neuromuscular Re-education: 8-22 mins  G Codes:      [name] 06/16/2014, 12:40 PM

## 2014-06-16 NOTE — Progress Notes (Signed)
Rehab admissions - Evaluated for possible admission.  I met with patient this am.  At that time he was having a severe coughing spell after drinking water.  I spoke with his daughter by phone.  Daughter interested in inpatient rehab rather than SNF.  I hope to meet with daughter today.  Call me for questions.  #[phone]

## 2014-06-16 NOTE — Progress Notes (Signed)
PATIENT DETAILS Name: [name] Age: 81 y.o. Sex: male Date of Birth: May 01, 1928 Admit Date: 06/12/2014 Admitting Physician [name] ZOX:[id number],JWJXB, [name]  Subjective: No complaints this morning except for constipation  Assessment/Plan: Active Problems:   Suspected Myasthenia gravis: Patient presented with worsening weakness along with intermittent diplopia, ptosis and dysphagia to outpatient neurology, subsequently referred to the hospitalist service. Seen by neuro hospitalist team, and started on IV Ig and Mestinon. MRI brain negative, acetylcholine receptor antibodies and striated muscle antibodies negative. Await further recommendations from neurology as patient scheduled to complete IVIG today.Defer further care to neurology. CIR consulted.    Dysphagia: Seen by speech therapy, current recommendations are dysphagia to liquids. Cautiously continue.    Dehydration: Resolved with IV fluids.    Hypertension: Continue benazepril.    Severe protein calorie malnutrition: Continue supplements    BPH: Continue Proscar  Disposition: Remain inpatient-CIR vs SNF on discharge  Antibiotics:  See below   Anti-infectives    None      DVT Prophylaxis: Prophylactic Heparin   Code Status: Full code   Family Communication None  Procedures:  None  CONSULTS:  neurology  MEDICATIONS: Scheduled Meds: . benazepril  20 mg Oral Daily  . feeding supplement (ENSURE COMPLETE)  237 mL Oral BID BM  . finasteride  5 mg Oral Daily  . heparin  5,000 Units Subcutaneous 3 times per day  . Immune Globulin 5%  400 mg/kg Intravenous Q24 Hr x 5  . polyethylene glycol  17 g Oral BID  . pyridostigmine  60 mg Oral 3 times per day  . sodium chloride  1,000 mL Intravenous Once  . sodium chloride  3 mL Intravenous Q12H   Continuous Infusions:   PRN Meds:.acetaminophen **OR** acetaminophen, alum & mag hydroxide-simeth, ondansetron **OR** ondansetron (ZOFRAN) IV,  oxyCODONE, sodium phosphate    PHYSICAL EXAM: Vital signs in last 24 hours: Filed Vitals:   06/15/14 1106 06/15/14 1351 06/15/14 2219 06/16/14 0546  BP: 152/71 135/74 138/62 151/62  Pulse: 69 73 71 66  Temp: 97.7 F (36.5 C) 98.3 F (36.8 C) 97.4 F (36.3 C) 97.8 F (36.6 C)  TempSrc: Oral Oral  Oral  Resp: 18 14 20 18   Height:      Weight:      SpO2: 99% 96% 95% 93%    Weight change:  Filed Weights   06/12/14 1223  Weight: 59 kg (130 lb 1.1 oz)   Body mass index is 19.2 kg/(m^2).   Gen Exam: Awake and alert with clear speech.  Left eye ptosis. Not in acute distress Neck: Supple, No JVD.   Chest: B/L Clear.   no rales or rhonchi  CVS: S1 S2 Regular, no murmurs.  Abdomen: soft, BS +, non tender, non distended.  Extremities: no edema, lower extremities warm to touch. Neurologic: Non Focal. But with generalized weakness. Skin: No Rash.   Wounds: N/A.   Intake/Output from previous day:  Intake/Output Summary (Last 24 hours) at 06/16/14 1103 Last data filed at 06/16/14 0600  Gross per 24 hour  Intake    240 ml  Output    125 ml  Net    115 ml     LAB RESULTS: CBC  Recent Labs Lab 06/12/14 1331 06/13/14 1010 06/14/14 0550  WBC 4.4 4.3 4.9  HGB 11.8* 12.4* 12.7*  HCT 37.6* 39.2 40.3  PLT 141* 154 150  MCV 93.8 93.3 94.6  MCH 29.4 29.5 29.8  MCHC  31.4 31.6 31.5  RDW 16.1* 16.2* 16.5*    Chemistries   Recent Labs Lab 06/12/14 1331 06/13/14 1010 06/14/14 0550  NA 139 139 137  K 4.0 4.0 4.1  CL 105 107 108  CO2 25 22 26   GLUCOSE 93 132* 122*  BUN 33* 29* 24*  CREATININE 1.49* 1.37* 1.30  CALCIUM 9.8 9.5 9.3    CBG: No results for input(s): GLUCAP in the last 168 hours.  GFR Estimated Creatinine Clearance: 35.9 mL/min (by C-G formula based on Cr of 1.3).  Coagulation profile  Recent Labs Lab 06/12/14 1331  INR 1.17    Cardiac Enzymes No results for input(s): CKMB, TROPONINI, MYOGLOBIN in the last 168 hours.  Invalid input(s):  CK  Invalid input(s): POCBNP No results for input(s): DDIMER in the last 72 hours. No results for input(s): HGBA1C in the last 72 hours. No results for input(s): CHOL, HDL, LDLCALC, TRIG, CHOLHDL, LDLDIRECT in the last 72 hours. No results for input(s): TSH, T4TOTAL, T3FREE, THYROIDAB in the last 72 hours.  Invalid input(s): FREET3 No results for input(s): VITAMINB12, FOLATE, FERRITIN, TIBC, IRON, RETICCTPCT in the last 72 hours. No results for input(s): LIPASE, AMYLASE in the last 72 hours.  Urine Studies No results for input(s): UHGB, CRYS in the last 72 hours.  Invalid input(s): UACOL, UAPR, USPG, UPH, UTP, UGL, UKET, UBIL, UNIT, UROB, ULEU, UEPI, UWBC, URBC, UBAC, CAST, UCOM, BILUA  MICROBIOLOGY: No results found for this or any previous visit (from the past 240 hour(s)).  RADIOLOGY STUDIES/RESULTS: Mr Sherrin DaisyBrain Wo Contrast  06/13/2014   CLINICAL DATA:  Progressive generalized weakness with diplopia, dysphagia, and bilateral ptosis. Suspected myasthenia gravis. Initial evaluation.  EXAM: MRI HEAD WITHOUT CONTRAST  TECHNIQUE: Multiplanar, multiecho pulse sequences of the brain and surrounding structures were obtained without intravenous contrast.  COMPARISON:  None.  FINDINGS: Diffuse prominence of the CSF containing spaces is compatible with generalized cerebral atrophy. No significant white matter changes present. No chronic infarction.  No abnormal foci of restricted diffusion to suggest acute intracranial infarct. Gray-white matter differentiation maintained. Normal intravascular flow voids present. No acute or chronic intracranial hemorrhage.  No mass lesion or midline shift. No hydrocephalus. No extra-axial fluid collection.  Craniocervical junction normal. Pituitary gland within normal limits. No acute abnormality seen about the orbits.  Paranasal sinuses and mastoid air cells are clear.  Mild degenerative changes noted within the upper cervical spine. Bone marrow signal intensity normal.  Scalp soft tissues within normal limits.  IMPRESSION: 1. No acute intracranial abnormality. 2. Moderate generalized cerebral atrophy.   Electronically Signed   By: Rise MuBenjamin  [name] M.D.   On: 06/13/2014 00:49   Dg Chest Port 1 View  06/12/2014   CLINICAL DATA:  Cough. Questionable aspiration. Generalized weakness.  EXAM: PORTABLE CHEST - 1 VIEW  COMPARISON:  Chest CT, 12/17/2013.  FINDINGS: The heart size and mediastinal contours are within normal limits. Both lungs are clear. The visualized skeletal structures are unremarkable.  IMPRESSION: No active disease.   Electronically Signed   By: Amie Portlandavid  Ormond M.D.   On: 06/12/2014 16:01   Dg Swallowing Func-speech Pathology  06/13/2014    Objective Swallowing Evaluation:    Patient Details  Name: [name] MRN: [id number] Date of Birth: 25-Jan-1929  Today's Date: 06/13/2014 Time: SLP Start Time (ACUTE ONLY): 0815-SLP Stop Time (ACUTE ONLY): 0850 SLP Time Calculation (min) (ACUTE ONLY): 35 min  Past Medical History:  Past Medical History  Diagnosis Date  . Hypertension   . Carotid  artery disease   . Weight loss, unintentional   . Protein-calorie malnutrition, moderate    Past Surgical History:  Past Surgical History  Procedure Laterality Date  . Hernia repair      Bilateral  . Prostate biopsy      WNL  . Hemorrhoid surgery    . Cataract extraction     HPI:  HPI: Elek Holderness [place] is a 81 y.o. male with a past medical history of  hypertension, gastroesophageal reflux disease, presenting as a transfer  from his neurologist office. Patient having progressive generalized  weakness that started 6 months to 1 year ago, that has been associated  with diplopia, dysphasia, and bilateral ptosis. Patient states that  symptoms occurring throughout the day however on occasion have been worse  at nighttime. He reports having multiple falls in in the past with his  last fall being one month ago. He also reports a 20 pound weight loss  throughout this period, attributing this to  minimal by mouth intake  stating he cannot have solid foods. He was evaluated by his neurologist on  day of admit felt that symptoms likely secondary to myasthenia gravis.   MRI brain and CXR negative.    No Data Recorded  Assessment / Plan / Recommendation CHL IP CLINICAL IMPRESSIONS 06/13/2014  Dysphagia Diagnosis Moderate oral phase dysphagia;Severe pharyngeal phase  dysphagia;Severe cervical esophageal phase dysphagia  Clinical impression Moderate oral and severe pharyngo-cervical esophageal  dysphagia with significant weak muscular contraction.  Said weakness  results in severe pharyngeal stasis across all consistencies that mix with  secretions retained.  Liquid swallows faciliated clearance but resulted in  mild aspiration of thin with sensation.  Pt is able to expectorate to  clear vallecular residuals he cannot clear into esophagus.  Pt  expectorated viscous secretions throughout entire evaluation-retained  secretions he is unable to swallow.   Pt is a high aspiration risk with  all intake but apparently has been tolerating given his negative CXR.  He  does report issues with excessive secretions for the last few months.  [name]  may desire to allow pt to continue po intake.  Concern for pt to be able  to support self nutritionally at this time due to level of dysphagia.        CHL IP TREATMENT RECOMMENDATION 06/13/2014  Treatment Plan Recommendations Therapy as outlined in treatment plan below      CHL IP DIET RECOMMENDATION 06/13/2014  Diet Recommendations Ice chips PRN after oral care;Dysphagia 2 (Fine  chop);Thin liquid  Liquid Administration via Cup;Straw  Medication Administration Crushed with puree  Compensations Slow rate;Small sips/bites;Multiple dry swallows after each  bite/sip;Follow solids with liquid;Effortful swallow;Hard cough after  swallow  Postural Changes and/or Swallow Maneuvers Seated upright 90  degrees;Upright 30-60 min after meal     CHL IP OTHER RECOMMENDATIONS 06/13/2014  Recommended  Consults (None)  Oral Care Recommendations Oral care BID  Other Recommendations (None)     CHL IP FOLLOW UP RECOMMENDATIONS 06/13/2014  Follow up Recommendations None     CHL IP FREQUENCY AND DURATION 06/13/2014  Speech Therapy Frequency (ACUTE ONLY) (None)  Treatment Duration 2 weeks      CHL IP REASON FOR REFERRAL 06/13/2014  Reason for Referral Objectively evaluate swallowing function     CHL IP ORAL PHASE 06/13/2014  Lips (None)  Tongue (None)  Mucous membranes (None)  Nutritional status (None)  Other (None)  Oxygen therapy (None)  Oral Phase Impaired  Oral - Pudding Teaspoon (None)  Oral - Pudding Cup (None)  Oral - Honey Teaspoon (None)  Oral - Honey Cup (None)  Oral - Honey Syringe (None)  Oral - Nectar Teaspoon (None)  Oral - Nectar Cup Weak lingual manipulation;Delayed oral transit;Reduced  posterior propulsion;Piecemeal swallowing  Oral - Nectar Straw (None)  Oral - Nectar Syringe (None)  Oral - Ice Chips (None)  Oral - Thin Teaspoon Weak lingual manipulation;Delayed oral  transit;Reduced posterior propulsion;Piecemeal swallowing  Oral - Thin Cup Weak lingual manipulation;Delayed oral transit;Reduced  posterior propulsion;Piecemeal swallowing  Oral - Thin Straw (None)  Oral - Thin Syringe (None)  Oral - Puree Weak lingual manipulation;Delayed oral transit;Reduced  posterior propulsion  Oral - Mechanical Soft Weak lingual manipulation;Delayed oral  transit;Reduced posterior propulsion  Oral - Regular (None)  Oral - Multi-consistency (None)  Oral - Pill (None)  Oral Phase - Comment (None)      CHL IP PHARYNGEAL PHASE 06/13/2014  Pharyngeal Phase Impaired  Pharyngeal - Pudding Teaspoon (None)  Penetration/Aspiration details (pudding teaspoon) (None)  Pharyngeal - Pudding Cup (None)  Penetration/Aspiration details (pudding cup) (None)  Pharyngeal - Honey Teaspoon (None)  Penetration/Aspiration details (honey teaspoon) (None)  Pharyngeal - Honey Cup (None)  Penetration/Aspiration details (honey cup) (None)   Pharyngeal - Honey Syringe (None)  Penetration/Aspiration details (honey syringe) (None)  Pharyngeal - Nectar Teaspoon (None)  Penetration/Aspiration details (nectar teaspoon) (None)  Pharyngeal - Nectar Cup Reduced pharyngeal peristalsis;Reduced epiglottic  inversion;Reduced tongue base retraction;Reduced airway/laryngeal  closure;Reduced laryngeal elevation;Pharyngeal residue -  valleculae;Pharyngeal residue - pyriform sinuses;Reduced anterior  laryngeal mobility  Penetration/Aspiration details (nectar cup) (None)  Pharyngeal - Nectar Straw (None)  Penetration/Aspiration details (nectar straw) (None)  Pharyngeal - Nectar Syringe (None)  Penetration/Aspiration details (nectar syringe) (None)  Pharyngeal - Ice Chips (None)  Penetration/Aspiration details (ice chips) (None)  Pharyngeal - Thin Teaspoon Reduced pharyngeal peristalsis;Reduced  epiglottic inversion;Reduced tongue base retraction;Reduced  airway/laryngeal closure;Reduced laryngeal  elevation;Penetration/Aspiration during swallow;Pharyngeal residue -  valleculae;Pharyngeal residue - pyriform sinuses;Reduced anterior  laryngeal mobility  Penetration/Aspiration details (thin teaspoon) Material enters airway,  remains ABOVE vocal cords and not ejected out  Pharyngeal - Thin Cup Reduced pharyngeal peristalsis;Reduced epiglottic  inversion;Pharyngeal residue - valleculae;Pharyngeal residue - pyriform  sinuses;Trace aspiration;Penetration/Aspiration during  swallow;Penetration/Aspiration after swallow;Reduced anterior laryngeal  mobility  Penetration/Aspiration details (thin cup) Material enters airway, passes  BELOW cords and not ejected out despite cough attempt by patient  Pharyngeal - Thin Straw (None)  Penetration/Aspiration details (thin straw) (None)  Pharyngeal - Thin Syringe (None)  Penetration/Aspiration details (thin syringe') (None)  Pharyngeal - Puree Reduced pharyngeal peristalsis;Reduced epiglottic  inversion;Pharyngeal residue -  valleculae;Reduced tongue base  retraction;Reduced airway/laryngeal closure;Reduced laryngeal  elevation;Reduced anterior laryngeal mobility  Penetration/Aspiration details (puree) (None)  Pharyngeal - Mechanical Soft Reduced pharyngeal peristalsis;Reduced  epiglottic inversion;Pharyngeal residue - valleculae;Reduced laryngeal  elevation;Reduced airway/laryngeal closure;Reduced tongue base  retraction;Reduced anterior laryngeal mobility  Penetration/Aspiration details (mechanical soft) (None)  Pharyngeal - Regular (None)  Penetration/Aspiration details (regular) (None)  Pharyngeal - Multi-consistency (None)  Penetration/Aspiration details (multi-consistency) (None)  Pharyngeal - Pill (None)  Penetration/Aspiration details (pill) (None)  Pharyngeal Comment pharyngeal residuals *(mixing with viscous secretions)  across consistencies noted with pt awareness - liquid swallows faciliated  clearance but resulted in mild aspiration, pt is able to expectorate to  clear vallecular residuals he does not swallow     CHL IP CERVICAL ESOPHAGEAL PHASE 06/13/2014  Cervical Esophageal Phase Impaired  Nectar Cup Reduced cricopharyngeal relaxation        Thin Teaspoon Reduced cricopharyngeal relaxation                                                                                                          Mills Koller, MS [facility] SLP [phone]     Jeoffrey Massed, [name]  [facility] Pager:[phone]  If 7PM-7AM, please contact night-coverage [email or web address] Password [email or web address] 06/16/2014, 11:03 AM   LOS: 4 days

## 2014-06-16 NOTE — Progress Notes (Signed)
Rehab admissions - I met with patient and family at the bedside.  All are interested in inpatient rehab.  Per neurology, patient may begin plasmapheresis.  I will follow along for progress.  Call me for questions.  #[phone]

## 2014-06-16 NOTE — Clinical Social Work Placement (Signed)
Clinical Social Work Department CLINICAL SOCIAL WORK PLACEMENT NOTE 06/16/2014  Patient:  [name],[name]  Account Number:  [id number] Admit date:  06/12/2014  Clinical Social Worker:  Cherre BlancJOSEPH BRYANT Zafar [place], [place]  Date/time:  06/16/2014 04:02 PM  Clinical Social Work is seeking post-discharge placement for this patient at the following level of care:   SKILLED NURSING   (*CSW will update this form in Epic as items are completed)   06/16/2014  [facility]family provided with [facility] of Clinical Social Work's list of facilities offering this level of care within the geographic area requested by the patient (or if unable, by the patient's family).  06/16/2014  Patient/family informed of their freedom to choose among providers that offer the needed level of care, that participate in Medicare, Medicaid or managed care program needed by the patient, have an available bed and are willing to accept the patient.  06/16/2014  Patient/family informed of [facility]' ownership interest in [facility], as well as of the fact that they are under no obligation to receive care at this facility.  PASARR submitted to EDS on 06/16/2014 PASARR number received on 06/16/2014  FL2 transmitted to all facilities in geographic area requested by pt/family on  06/16/2014 FL2 transmitted to all facilities within larger geographic area on   Patient informed that his/her managed care company has contracts with or will negotiate with  certain facilities, including the following:     Patient/family informed of bed offers received:  06/16/2014 Patient chooses bed at  Physician recommends and patient chooses bed at    Patient to be transferred to  on   Patient to be transferred to facility by  Patient and family notified of transfer on  Name of family member notified:    The following physician request were entered in Epic:   Additional Comments:    Roddie McBryant Mael Delap MSW, [place],  [place], [phone]

## 2014-06-16 NOTE — Clinical Social Work Psychosocial (Signed)
Clinical Social Work Department BRIEF PSYCHOSOCIAL ASSESSMENT 06/16/2014  Patient:  [name], [name]     Account Number:  [id number]     Admit date:  06/12/2014  Clinical Social Worker:  Lovey Newcomer  Date/Time:  06/16/2014 03:55 PM  Referred by:  Physician  Date Referred:  06/16/2014 Referred for  SNF Placement   Other Referral:   NA   Interview type:  Patient Other interview type:   Patient alert and oriented at time of assessment.    PSYCHOSOCIAL DATA Living Status:  ALONE Admitted from facility:   Level of care:   Primary support name:  [name] Primary support relationship to patient:  CHILD, ADULT Degree of support available:   Support is strong.    CURRENT CONCERNS Current Concerns  Post-Acute Placement   Other Concerns:   NA    SOCIAL WORK ASSESSMENT / PLAN CSW met with patient at bedside to complete assessment. Patient states that he lives alone at home but has good family support. Patient and daughter [name] are hoping that the patient will be admitted to CIR before returning home. Patient was limited in his engagement with CSW and asked that CSW speak with daughter [name]. CSW explained SNF search/placement process and answered her questions. Patient and daughter seem to have good insight into reason for patient's admission. Daughter remains optimistic that patient will be able to improve with rehabilitation.   Assessment/plan status:  Psychosocial Support/Ongoing Assessment of Needs Other assessment/ plan:   Complete Fl2, Fax, PASRR   Information/referral to community resources:   CSW contact information and SNF list given.    PATIENT'S/FAMILY'S RESPONSE TO PLAN OF CARE: Patient and patient's daughter plans for discharge to CIR or SNF once medically stable. CSW will assist as apporpriate.       Liz Beach MSW, [place], [place], [phone]

## 2014-06-16 NOTE — Progress Notes (Signed)
NIF > -40 VC 1.6L Good patient effort.   

## 2014-06-16 NOTE — Progress Notes (Signed)
Subjective: Patient has no complaints today.  Denies excessive salivation, diarrhea or abdominal discomfort. Feels slightly stronger but still having difficulty swallowing.   Objective: Current vital signs: BP 151/62 mmHg  Pulse 66  Temp(Src) 97.8 F (36.6 C) (Oral)  Resp 18  Ht 5\' 9"  (1.753 m)  Wt 59 kg (130 lb 1.1 oz)  BMI 19.20 kg/m2  SpO2 93% Vital signs in last 24 hours: Temp:  [97.4 F (36.3 C)-98.3 F (36.8 C)] 97.8 F (36.6 C) (02/22 0546) Pulse Rate:  [66-73] 66 (02/22 0546) Resp:  [14-20] 18 (02/22 0546) BP: (135-152)/(62-74) 151/62 mmHg (02/22 0546) SpO2:  [93 %-99 %] 93 % (02/22 0546)  Intake/Output from previous day: 02/21 0701 - 02/22 0700 In: 240 [P.O.:240] Out: 125 [Urine:125] Intake/Output this shift:   Nutritional status: DIET DYS 2  Neurologic Exam: General: NAD Mental Status: Alert, oriented, thought content appropriate.  Speech fluent without evidence of aphasia.  Able to follow 3 step commands without difficulty. Cranial Nerves: II: Visual fields grossly normal, pupils equal, round, reactive to light and accommodation III,IV, VI: ptosis present left eye.  Extra ocular motions intact bilaterally V,VII: smile symmetric, facial light touch sensation normal bilaterally VIII: hearing normal bilaterally IX,X: gag reflex present XI: bilateral shoulder shrug XII: midline tongue extension without atrophy or fasciculations  Motor: Right : Upper extremity   3+/5 deltoids    Left:     Upper extremity  3+/5 deltoids             Hand grip 5-/5  Lower extremity   5-/5       Lower extremity   5-/5 Tone and bulk:normal tone throughout; no atrophy noted Sensory: Pinprick and light touch intact throughout, bilaterally Deep Tendon Reflexes:  1+ throughout with no AJ Plantars: Right: downgoing   Left: downgoing Cerebellar: normal finger-to-nose,  normal heel-to-shin test    Lab Results: Basic Metabolic Panel:  Recent Labs Lab 06/12/14 1331  06/13/14 1010 06/14/14 0550  NA 139 139 137  K 4.0 4.0 4.1  CL 105 107 108  CO2 25 22 26   GLUCOSE 93 132* 122*  BUN 33* 29* 24*  CREATININE 1.49* 1.37* 1.30  CALCIUM 9.8 9.5 9.3    Liver Function Tests:  Recent Labs Lab 06/12/14 1331  AST 113*  ALT 75*  ALKPHOS 36*  BILITOT 0.7  PROT 6.2  ALBUMIN 2.5*   No results for input(s): LIPASE, AMYLASE in the last 168 hours. No results for input(s): AMMONIA in the last 168 hours.  CBC:  Recent Labs Lab 06/12/14 1331 06/13/14 1010 06/14/14 0550  WBC 4.4 4.3 4.9  HGB 11.8* 12.4* 12.7*  HCT 37.6* 39.2 40.3  MCV 93.8 93.3 94.6  PLT 141* 154 150    Cardiac Enzymes: No results for input(s): CKTOTAL, CKMB, CKMBINDEX, TROPONINI in the last 168 hours.  Lipid Panel: No results for input(s): CHOL, TRIG, HDL, CHOLHDL, VLDL, LDLCALC in the last 168 hours.  CBG: No results for input(s): GLUCAP in the last 168 hours.  Microbiology: No results found for this or any previous visit.  Coagulation Studies: No results for input(s): LABPROT, INR in the last 72 hours.  Imaging: No results found.  Medications:  Scheduled: . benazepril  20 mg Oral Daily  . feeding supplement (ENSURE COMPLETE)  237 mL Oral BID BM  . finasteride  5 mg Oral Daily  . heparin  5,000 Units Subcutaneous 3 times per day  . Immune Globulin 5%  400 mg/kg Intravenous Q24 Hr x 5  .  pyridostigmine  60 mg Oral 3 times per day  . sodium chloride  1,000 mL Intravenous Once  . sodium chloride  3 mL Intravenous Q12H    Felicie Morn PA-C [facility] [phone]  06/16/2014, 11:00 AM   Patient seen and examined.  Clinical course and management discussed.  Necessary edits performed.  I agree with the above.  Assessment and plan of care developed and discussed below.    Assessment/Plan: 81 YO male admitted for management of MG.  He has received 4/5 IVIg treatments with no significant  Improvement noted.  MBS showed dysphagia due to weak muscle  contraction. Currently on dysphagia 2 diet. Antibodies for striated muscle and binding Ach receptor antibodies were negative. Remainder of myasthenia gravis panel is pending. Discussed with lab and found it was not drawn. I have called lab and they are going to manually place order.   Recommendations: 1.  With no significant improvement on IVIg would discontinue and start TPE.  Anticipate 5 treatments.  Catheter to be arranged through [facility].   2.  Continue Mestinon at current dose.  3.  Will continue to follow up lab work.    Thana Farr, MD [facility] [phone]  06/16/2014  11:56 AM

## 2014-06-16 NOTE — Progress Notes (Signed)
NIF -30, FVC 1.3L. Pt had good effort.

## 2014-06-16 NOTE — Consult Note (Signed)
Physical Medicine and Rehabilitation Consult Reason for Consult: Myasthenia gravis Referring Physician: [facility]   HPI: [name] is a 81 y.o. right handed male with history of hypertension. Patient had been living alone and independent until the spring of 2015 with noted gradual onset of generalized weakness, double vision, 20 pound weight loss and dysphagia followed by outpatient neurology services with workup concerning for myasthenia gravis. Presented 06/12/2014 with increasing weakness. MRI of the brain showed no acute intracranial abnormality. Placed on IVIG 5 doses with ongoing workup per neurology services as well as possible need for plasmapheresis. Maintain on a dysphagia 2 thin liquid diet. Subcutaneous heparin for DVT prophylaxis. Physical and occupational therapy evaluations completed with recommendations of physical medicine rehabilitation consult   Review of Systems  Constitutional: Positive for weight loss.  Eyes: Positive for double vision.  Respiratory: Positive for cough.   Gastrointestinal:       GERD  Genitourinary: Positive for urgency.  Neurological: Positive for weakness.       Dysphagia  Psychiatric/Behavioral: Positive for depression.  All other systems reviewed and are negative.  Past Medical History  Diagnosis Date  . Hypertension   . Carotid artery disease   . Weight loss, unintentional   . Protein-calorie malnutrition, moderate    Past Surgical History  Procedure Laterality Date  . Hernia repair      Bilateral  . Prostate biopsy      WNL  . Hemorrhoid surgery    . Cataract extraction     Family History  Problem Relation Age of Onset  . Parkinson's disease Brother     Deceased - 2074  . Heart disease Sister     Deceased - 7982  . Heart attack Brother     Deceased - 3678  . Stroke Brother   . Heart disease Mother    Social History:  reports that he quit smoking about 44 years ago. He does not have any smokeless tobacco history on file.  He reports that he does not drink alcohol or use illicit drugs. Allergies:  Allergies  Allergen Reactions  . Sulfa Antibiotics Itching   Medications Prior to Admission  Medication Sig Dispense Refill  . aspirin 325 MG tablet Take 325 mg by mouth daily.    . benazepril (LOTENSIN) 20 MG tablet Take 20 mg by mouth daily.    . finasteride (PROSCAR) 5 MG tablet Take 5 mg by mouth daily.    . mirtazapine (REMERON) 15 MG tablet Take 15 mg by mouth at bedtime.    Marland Kitchen. omeprazole (PRILOSEC) 20 MG capsule Take 20 mg by mouth 2 (two) times daily.      Home: Home Living Family/patient expects to be discharged to:: Private residence Living Arrangements: Alone Available Help at Discharge: Family, Available 24 hours/day (live across the street) Type of Home: House Home Access: Stairs to enter Home Layout: One level, Other (Comment) (1 room is raised/sunk by 1 step) Home Equipment: None  Functional History: Prior Function Level of Independence: Independent ("cruised" along furniture) Functional Status:  Mobility: Bed Mobility Overal bed mobility: Needs Assistance Bed Mobility: Supine to Sit, Sit to Supine Rolling: Min assist Sidelying to sit: Min assist Supine to sit: Min assist Sit to supine: Min assist General bed mobility comments: Pt required heavy min assist with hand held assist to pull to sitting at EOB. Pt able to use bed rail to assist. VC's for sequencing to get pt to scoot to EOB. Pt required assistance to  return LEs to bed when returning supine.  Transfers Overall transfer level: Needs assistance Equipment used: Rolling walker (2 wheeled) Transfers: Sit to/from Stand Sit to Stand: Min assist Stand pivot transfers: Min guard General transfer comment: Min A to stand. Pt demonstrates unsafe technique and pulls on RW. Multimodal cues for hand placement and min A to power up to standing.  Ambulation/Gait Ambulation/Gait assistance: Min guard Ambulation Distance (Feet): 100  Feet Assistive device: Rolling walker (2 wheeled) Gait Pattern/deviations: Step-through pattern, Decreased step length - right, Decreased step length - left Gait velocity: slower Gait velocity interpretation: Below normal speed for age/gender General Gait Details: General weakness and fatigue noted with gait.  Pt subjectively reporting he just feels "so weak".  Some difficulty maneuvering the RW in tight spaces in the room and needed cues to keep RW with him.    ADL: ADL Overall ADL's : Needs assistance/impaired Eating/Feeding: Supervision/ safety, Sitting (90*, with full supervision per SLP) Grooming: Minimal assistance, Standing, Wash/dry hands Grooming Details (indicate cue type and reason): pt needed VC's to locate items needed. Min A to stand.  Upper Body Bathing: Set up, Sitting Lower Body Bathing: Moderate assistance, Sit to/from stand Lower Body Bathing Details (indicate cue type and reason): Assist to reach Bil LEs and min A to stand Upper Body Dressing : Minimal assistance, Sitting Upper Body Dressing Details (indicate cue type and reason): due to truncal weakness Lower Body Dressing: Maximal assistance, Sit to/from stand Toilet Transfer: Minimal assistance, Ambulation, RW, Comfort height toilet, Grab bars Toilet Transfer Details (indicate cue type and reason): VC's for hand placement. Min A to power up due to LE weakness. Pt demonstrated unsafe technique qith  Toileting- Clothing Manipulation and Hygiene: Minimal assistance, Sitting/lateral lean Toileting - Clothing Manipulation Details (indicate cue type and reason): Pt performed toilet hygiene in sitting and required min A to stand General ADL Comments: Pt presents with weakness and decreased endurance. Pt requires assistance to get OOB and when ambulating, pt required min A to maintain balance due to instances of stumbling. Without VC's pt picked up RW to maneuver around obstacles and feel that pt would be a high fall risk if  returning home by himself. Pt required min A to power up from bed/toilet due to LE weakness.   Cognition: Cognition Overall Cognitive Status: Within Functional Limits for tasks assessed Cognition Arousal/Alertness: Awake/alert Behavior During Therapy: WFL for tasks assessed/performed, Flat affect Overall Cognitive Status: Within Functional Limits for tasks assessed  Blood pressure 151/62, pulse 66, temperature 97.8 F (36.6 C), temperature source Oral, resp. rate 18, height  (1.753 m), weight 59 kg (130 lb 1.1 oz), SpO2 93 %. Physical Exam  Constitutional: He is oriented to person, place, and time.  81 year old right-handed male sitting up in chair  HENT:  Head: Normocephalic.  Eyes: EOM are normal.  Neck: Normal range of motion. Neck supple. No thyromegaly present.  Cardiovascular: Normal rate and regular rhythm.   Respiratory:  Good inspiratory effort some upper airway rhonchi  GI: Soft. Bowel sounds are normal. He exhibits no distension.  Neurological: He is alert and oriented to person, place, and time.  Mild decreased fine motor skills. Mild ptosis and facial diplegia. No sensory deficits. UE's 4/5 prox to distal. LE's. 4/5 prox to distal. Speech slightly slurred. Seems to have intact insight and awareness.   Skin: Skin is warm and dry.  Psychiatric: He has a normal mood and affect. His behavior is normal.    No results found for this  or any previous visit (from the past 24 hour(s)). No results found.  Assessment/Plan: Diagnosis: myasthenia gravis 1. Does the need for close, 24 hr/day medical supervision in concert with the patient's rehab needs make it unreasonable for this patient to be served in a less intensive setting? Yes 2. Co-Morbidities requiring supervision/potential complications: htn, dysphagia 3. Due to bladder management, bowel management, safety, skin/wound care, disease management and medication administration, does the patient require 24 hr/day rehab  nursing? Yes 4. Does the patient require coordinated care of a physician, rehab nurse, PT (1-2 hrs/day, 5 days/week), OT (1-2 hrs/day, 5 days/week) and SLP (1-2 hrs/day, 5 days/week) to address physical and functional deficits in the context of the above medical diagnosis(es)? Yes Addressing deficits in the following areas: balance, endurance, locomotion, strength, transferring, bowel/bladder control, bathing, dressing, feeding, grooming, toileting, speech, swallowing and psychosocial support 5. Can the patient actively participate in an intensive therapy program of at least 3 hrs of therapy per day at least 5 days per week? Yes 6. The potential for patient to make measurable gains while on inpatient rehab is excellent 7. Anticipated functional outcomes upon discharge from inpatient rehab are modified independent and supervision  with PT, modified independent and supervision with OT, modified independent and supervision with SLP. 8. Estimated rehab length of stay to reach the above functional goals is: 7-11 days 9. Does the patient have adequate social supports and living environment to accommodate these discharge functional goals? Yes 10. Anticipated D/C setting: Home 11. Anticipated post D/C treatments: HH therapy 12. Overall Rehab/Functional Prognosis: excellent  RECOMMENDATIONS: This patient's condition is appropriate for continued rehabilitative care in the following setting: CIR Patient has agreed to participate in recommended program. Potentially Note that insurance prior authorization may be required for reimbursement for recommended care.  Comment: Need to speak with children. Rehab Admissions Coordinator to follow up.  Thanks,  Ranelle Oyster, MD, [place] [facility]     06/16/2014

## 2014-06-17 DIAGNOSIS — G7000 Myasthenia gravis without (acute) exacerbation: Secondary | ICD-10-CM | POA: Insufficient documentation

## 2014-06-17 LAB — COMPREHENSIVE METABOLIC PANEL
ALT: 61 U/L — ABNORMAL HIGH (ref 0–53)
ANION GAP: 3 — AB (ref 5–15)
AST: 86 U/L — AB (ref 0–37)
Albumin: 2.1 g/dL — ABNORMAL LOW (ref 3.5–5.2)
Alkaline Phosphatase: 39 U/L (ref 39–117)
BILIRUBIN TOTAL: 0.9 mg/dL (ref 0.3–1.2)
BUN: 27 mg/dL — AB (ref 6–23)
CO2: 26 mmol/L (ref 19–32)
CREATININE: 1.31 mg/dL (ref 0.50–1.35)
Calcium: 8.9 mg/dL (ref 8.4–10.5)
Chloride: 103 mmol/L (ref 96–112)
GFR calc Af Amer: 56 mL/min — ABNORMAL LOW (ref >90)
GFR, EST NON AFRICAN AMERICAN: 49 mL/min — AB (ref >90)
Glucose, Bld: 123 mg/dL — ABNORMAL HIGH (ref 70–99)
POTASSIUM: 4.4 mmol/L (ref 3.5–5.1)
Sodium: 132 mmol/L — ABNORMAL LOW (ref 135–145)
Total Protein: 6.8 g/dL (ref 6.0–8.3)

## 2014-06-17 LAB — CBC
HCT: 37.2 % — ABNORMAL LOW (ref 39.0–52.0)
Hemoglobin: 12.1 g/dL — ABNORMAL LOW (ref 13.0–17.0)
MCH: 29.8 pg (ref 26.0–34.0)
MCHC: 32.5 g/dL (ref 30.0–36.0)
MCV: 91.6 fL (ref 78.0–100.0)
Platelets: 155 10*3/uL (ref 150–400)
RBC: 4.06 MIL/uL — AB (ref 4.22–5.81)
RDW: 16.3 % — ABNORMAL HIGH (ref 11.5–15.5)
WBC: 4.8 10*3/uL (ref 4.0–10.5)

## 2014-06-17 LAB — POCT I-STAT, CHEM 8
BUN: 29 mg/dL — ABNORMAL HIGH (ref 6–23)
CALCIUM ION: 1.3 mmol/L (ref 1.13–1.30)
Chloride: 99 mmol/L (ref 96–112)
Creatinine, Ser: 1.4 mg/dL — ABNORMAL HIGH (ref 0.50–1.35)
GLUCOSE: 100 mg/dL — AB (ref 70–99)
HEMATOCRIT: 40 % (ref 39.0–52.0)
HEMOGLOBIN: 13.6 g/dL (ref 13.0–17.0)
Potassium: 4.3 mmol/L (ref 3.5–5.1)
Sodium: 136 mmol/L (ref 135–145)
TCO2: 21 mmol/L (ref 0–100)

## 2014-06-17 LAB — RETICULOCYTES
RBC.: 4.06 MIL/uL — ABNORMAL LOW (ref 4.22–5.81)
RETIC COUNT ABSOLUTE: 32.5 10*3/uL (ref 19.0–186.0)
Retic Ct Pct: 0.8 % (ref 0.4–3.1)

## 2014-06-17 MED ORDER — SODIUM CHLORIDE 0.9 % IV SOLN
4.0000 g | Freq: Once | INTRAVENOUS | Status: AC
  Administered 2014-06-17: 4 g via INTRAVENOUS
  Filled 2014-06-17: qty 40

## 2014-06-17 MED ORDER — CALCIUM CARBONATE ANTACID 500 MG PO CHEW
CHEWABLE_TABLET | ORAL | Status: AC
  Filled 2014-06-17: qty 1

## 2014-06-17 MED ORDER — BOOST PLUS PO LIQD
237.0000 mL | Freq: Two times a day (BID) | ORAL | Status: DC
  Filled 2014-06-17 (×5): qty 237

## 2014-06-17 MED ORDER — CALCIUM CARBONATE ANTACID 500 MG PO CHEW
2.0000 | CHEWABLE_TABLET | ORAL | Status: DC
  Filled 2014-06-17 (×2): qty 2

## 2014-06-17 MED ORDER — ACETAMINOPHEN 325 MG PO TABS: 650.0000 mg | ORAL_TABLET | ORAL | Status: DC | PRN

## 2014-06-17 MED ORDER — ACD FORMULA A 0.73-2.45-2.2 GM/100ML VI SOLN
500.0000 mL | Status: DC
  Administered 2014-06-17: 500 mL via INTRAVENOUS
  Filled 2014-06-17: qty 500

## 2014-06-17 MED ORDER — CALCIUM CARBONATE ANTACID 500 MG PO CHEW
CHEWABLE_TABLET | ORAL | Status: AC
  Administered 2014-06-17: 400 mg
  Filled 2014-06-17: qty 1

## 2014-06-17 MED ORDER — ACD FORMULA A 0.73-2.45-2.2 GM/100ML VI SOLN
Status: AC
  Administered 2014-06-17: 16:00:00
  Filled 2014-06-17: qty 500

## 2014-06-17 MED ORDER — CYCLOBENZAPRINE HCL 10 MG PO TABS
10.0000 mg | ORAL_TABLET | Freq: Once | ORAL | Status: AC
  Administered 2014-06-17: 10 mg via ORAL
  Filled 2014-06-17: qty 1

## 2014-06-17 MED ORDER — SODIUM CHLORIDE 0.9 % IV SOLN
INTRAVENOUS | Status: AC
  Administered 2014-06-17 (×2): via INTRAVENOUS_CENTRAL
  Filled 2014-06-17 (×3): qty 200

## 2014-06-17 MED ORDER — OXYCODONE HCL 5 MG PO TABS
5.0000 mg | ORAL_TABLET | Freq: Once | ORAL | Status: AC
  Administered 2014-06-17: 5 mg via ORAL
  Filled 2014-06-17: qty 1

## 2014-06-17 MED ORDER — HEPARIN SODIUM (PORCINE) 1000 UNIT/ML IJ SOLN
1000.0000 [IU] | Freq: Once | INTRAMUSCULAR | Status: DC
  Filled 2014-06-17: qty 1

## 2014-06-17 MED ORDER — DIPHENHYDRAMINE HCL 25 MG PO CAPS: 25.0000 mg | ORAL_CAPSULE | Freq: Four times a day (QID) | ORAL | Status: DC | PRN

## 2014-06-17 MED ORDER — MAGNESIUM HYDROXIDE 400 MG/5ML PO SUSP
30.0000 mL | Freq: Once | ORAL | Status: AC
  Administered 2014-06-17: 30 mL via ORAL
  Filled 2014-06-17: qty 30

## 2014-06-17 MED ORDER — ACD FORMULA A 0.73-2.45-2.2 GM/100ML VI SOLN
Status: AC
  Filled 2014-06-17: qty 500

## 2014-06-17 NOTE — Progress Notes (Signed)
Rt went to do NIF/VC pt is in dialysis.

## 2014-06-17 NOTE — Progress Notes (Signed)
Subjective: Patient awoke this AM feeling a little disoriented and weak.  Currently he states he feels ok.  Suctioning less.  Denies abdominal pain or SOB.    Objective: Current vital signs: BP 152/70 mmHg  Pulse 65  Temp(Src) 98.3 F (36.8 C) (Oral)  Resp 16  Ht  (1.753 m)  Wt 59 kg (130 lb 1.1 oz)  BMI 19.20 kg/m2  SpO2 96% Vital signs in last 24 hours: Temp:  [98.3 F (36.8 C)-98.5 F (36.9 C)] 98.3 F (36.8 C) (02/23 0536) Pulse Rate:  [65-89] 65 (02/23 0536) Resp:  [16-17] 16 (02/23 0536) BP: (138-168)/(68-101) 152/70 mmHg (02/23 0536) SpO2:  [94 %-96 %] 96 % (02/23 0536)  Intake/Output from previous day: 02/22 0701 - 02/23 0700 In: 360 [P.O.:360] Out: 550 [Urine:550] Intake/Output this shift:   Nutritional status: DIET DYS 2  Neurologic Exam: Mental Status: Alert, oriented to [place], year of 2016 and moth of February.  Speech fluent without evidence of aphasia. Able to follow 3 step commands without difficulty. Cranial Nerves: II: Visual fields grossly normal, pupils equal, round, reactive to light and accommodation III,IV, VI: ptosis present left eye. Extra ocular motions intact bilaterally V,VII: smile symmetric, facial light touch sensation normal bilaterally VIII: hearing normal bilaterally IX,X: gag reflex present XI: bilateral shoulder shrug XII: midline tongue extension without atrophy or fasciculations  Motor: Right :Upper extremity  3+/5 deltoidsLeft: Upper extremity  3+/5 deltoids     4/5 bicep/tricep       4/5 bicep/tricep      4/5 Hand grip        4/5 hand grip    Lower extremity 4/5Lower extremity 4/5 Tone and bulk:normal tone throughout; no atrophy noted Sensory: Pinprick and light touch intact throughout, bilaterally Deep Tendon Reflexes:  1+ throughout with no  AJ Plantars: Right: downgoingLeft: downgoing Cerebellar: normal finger-to-nose,   Lab Results: Basic Metabolic Panel:  Recent Labs Lab 06/12/14 1331 06/13/14 1010 06/14/14 0550  NA 139 139 137  K 4.0 4.0 4.1  CL 105 107 108  CO2 GLUCOSE 93 132* 122*  BUN 33* 29* 24*  CREATININE 1.49* 1.37* 1.30  CALCIUM 9.8 9.5 9.3    Liver Function Tests:  Recent Labs Lab 06/12/14 1331  AST 113*  ALT 75*  ALKPHOS 36*  BILITOT 0.7  PROT 6.2  ALBUMIN 2.5*   No results for input(s): LIPASE, AMYLASE in the last 168 hours. No results for input(s): AMMONIA in the last 168 hours.  CBC:  Recent Labs Lab 06/12/14 1331 06/13/14 1010 06/14/14 0550  WBC 4.4 4.3 4.9  HGB 11.8* 12.4* 12.7*  HCT 37.6* 39.2 40.3  MCV 93.8 93.3 94.6  PLT 141* 154 150    Cardiac Enzymes: No results for input(s): CKTOTAL, CKMB, CKMBINDEX, TROPONINI in the last 168 hours.  Lipid Panel: No results for input(s): CHOL, TRIG, HDL, CHOLHDL, VLDL, LDLCALC in the last 168 hours.  CBG: No results for input(s): GLUCAP in the last 168 hours.  Microbiology: No results found for this or any previous visit.  Coagulation Studies: No results for input(s): LABPROT, INR in the last 72 hours.  Imaging: Dg Chest Port 1 View  06/16/2014   CLINICAL DATA:  81 year old male status post central line placement, positioning. Initial encounter.  EXAM: PORTABLE CHEST - 1 VIEW  COMPARISON:  06/12/2014 and earlier.  FINDINGS: Portable AP upright view at 1535 hours. Left IJ approach central line now in place. Tip projects at the cavoatrial junction level.  Lower lung volumes. Stable cardiac size and mediastinal contours. No pneumothorax. Increased interstitial opacity, in part felt due to crowding. Increased retrocardiac opacity, Suggestion of small left pleural effusion.  IMPRESSION: 1. Left IJ approach central line placed, tip at the cavoatrial junction level. 2. Lower lung volumes with  increased crowding of markings and possible vascular congestion. 3. Increased lung base opacity suspected due to left pleural effusion.   Electronically Signed   By: Odessa FlemingH  Hall M.D.   On: 06/16/2014 15:54    Medications:  Scheduled: . benazepril  20 mg Oral Daily  . feeding supplement (ENSURE COMPLETE)  237 mL Oral BID BM  . finasteride  5 mg Oral Daily  . heparin  3,000 Units Intracatheter Once  . heparin  5,000 Units Subcutaneous 3 times per day  . magnesium hydroxide  30 mL Oral Once  . polyethylene glycol  17 g Oral BID  . pyridostigmine  60 mg Oral 3 times per day  . sodium chloride  1,000 mL Intravenous Once  . sodium chloride  3 mL Intravenous Q12H   Felicie MornDavid Smith PA-C [facility] [phone]  Patient seen and examined.  Clinical course and management discussed.  Necessary edits performed.  I agree with the above.  Assessment and plan of care developed and discussed below.    Assessment/Plan:  81 YO male admitted for management of MG. He has received 4/5 IVIg treatments with no significant Improvement noted. MBS showed dysphagia due to weak muscle contraction. Currently on dysphagia 2 diet. Antibodies for striated muscle and binding Ach receptor antibodies were negative. Remainder of myasthenia gravis panel 2 is pending. Central line for Plasma exchange has been placed and awaiting first treatment.    Recommendations: 1.  First TPE treatment today.  HD contacted.  To continue QOD for 5 treatments     Thana FarrLeslie Luismanuel Corman, MD [facility] [phone]  06/17/2014  1:20 PM

## 2014-06-17 NOTE — Progress Notes (Signed)
Rehab admissions - I spoke with Dr. Riley KillSwartz about possible inpatient rehab admission for patient.  Would like to see how patient does with plasma exchange and how he does with therapy after exchange.  Patient doing well with therapy needing only minguard assist to walk 180 ft.  Patient may not need inpatient rehab stay since he is doing so well with therapies.  Call me for questions.  #[phone]

## 2014-06-17 NOTE — Progress Notes (Addendum)
Pt had a NIF of -50 and VC of 1.5L with good pt effort

## 2014-06-17 NOTE — Progress Notes (Signed)
Occupational Therapy Treatment Patient Details Name: [name] MRN: [id number] DOB: 02-24-1929 Today's Date: 06/17/2014    History of present illness Pt is an 81 y.o. Male admitted 06/12/14 from his neurologist office due to progressive generalized weakness and associated diplopia, dysphagia, ptosis. Pt has had multiple falls at home. Neurologist admitted pt due to symptoms likely 2/2 myasthenia gravis. PMH: HTN, GERD. Pt now being treated for myasthenia gravis on IVIG and Mestinon. MRI brain showed no acute findings. MBS by SLP showed dysphagia.    OT comments  Pt progressing. Continue to recommend CIR for rehab.  Follow Up Recommendations  CIR;Supervision/Assistance - 24 hour    Equipment Recommendations  Other (comment) (tbd)    Recommendations for Other Services      Precautions / Restrictions Precautions Precautions: Fall Precaution Comments: pt/family report multiple falls at home Restrictions Weight Bearing Restrictions: No       Mobility Bed Mobility               General bed mobility comments: not assessed  Transfers Overall transfer level: Needs assistance Equipment used: Rolling walker (2 wheeled) Transfers: Sit to/from Stand Sit to Stand: Min guard         General transfer comment: cues for technique. Assist and cues to scoot to edge of chair.        ADL Overall ADL's : Needs assistance/impaired     Grooming: Sitting;Standing;Oral care;Wash/dry face;Wash/dry hands;Applying deodorant;Min guard (shaved as well)   Upper Body Bathing: Min guard;Standing   Lower Body Bathing: Min guard;Sit to/from stand Lower Body Bathing Details (indicate cue type and reason): did not wash feet or lower legs Upper Body Dressing : Set up;Supervision/safety;Sitting;Standing Upper Body Dressing Details (indicate cue type and reason): OT assisted with doffing gown while standing so pt could bathe UB. Lower Body Dressing: Maximal assistance;Sitting/lateral leans  (socks)   Toilet Transfer: Min guard;Ambulation;BSC           Functional mobility during ADLs: Rolling walker;Min guard General ADL Comments: Pt ambulated to sink and performed ADLs. Educated on deep breathing technique. Encouraged pt to cough when at sink. Explained they make sockaid and reacher to assist pt with LB dressing.      Vision                     Perception     Praxis      Cognition  Awake/Alert Behavior During Therapy: WFL for tasks assessed/performed;Flat affect Overall Cognitive Status: Within Functional Limits for tasks assessed                       Extremity/Trunk Assessment               Exercises     Shoulder Instructions       General Comments      Pertinent Vitals/ Pain       Pain Assessment: 0-10 Pain Score: 1  Pain Location: left side of neck; reports pulling in back reaching for socks Pain Descriptors / Indicators: Sore (pulling) Pain Intervention(s): Monitored during session  Home Living                                          Prior Functioning/Environment              Frequency Min 2X/week     Progress Toward Goals  OT Goals(current goals can now be found in the care plan section)  Progress towards OT goals: Progressing toward goals  Acute Rehab OT Goals Patient Stated Goal: not stated OT Goal Formulation: With patient Time For Goal Achievement: 06/27/14 Potential to Achieve Goals: Good ADL Goals Pt Will Perform Grooming: with supervision;standing Pt Will Perform Upper Body Bathing: with set-up;standing Pt Will Perform Lower Body Bathing: with supervision;with set-up;sit to/from stand Pt Will Perform Upper Body Dressing: with set-up;with supervision;sitting Pt Will Perform Lower Body Dressing: with set-up;with supervision;sit to/from stand Pt Will Transfer to Toilet: with supervision;ambulating;bedside commode Pt Will Perform Toileting - Clothing Manipulation and hygiene: with  supervision;sit to/from stand Additional ADL Goal #1: Pt will perform bed mobility with Supervision to prepare for ADLs.   Plan Discharge plan remains appropriate    Co-evaluation                 End of Session Equipment Utilized During Treatment: Gait belt;Rolling walker   Activity Tolerance Patient tolerated treatment well   Patient Left in chair;with call bell/phone within reach;with chair alarm set   Nurse Communication          Time: 1610-9604 OT Time Calculation (min): 30 min  Charges: OT General Charges $OT Visit: 1 Procedure OT Treatments $Self Care/Home Management : 23-37 mins  Earlie Raveling OTR/L [phone] 06/17/2014, 1:02 PM

## 2014-06-17 NOTE — Progress Notes (Signed)
Pt off unit unable to get vital signs

## 2014-06-17 NOTE — Progress Notes (Signed)
PATIENT DETAILS Name: [name] Age: 81 y.o. Sex: male Date of Birth: 1928-09-17 Admit Date: 06/12/2014 Admitting Physician Jeralyn Bennett, MD ZOX:[id number],[id number], MD  Subjective: No complaints, continues to complain of constipation  Assessment/Plan: Active Problems:   Suspected Myasthenia gravis: Patient presented with worsening weakness along with intermittent diplopia, ptosis and dysphagia to outpatient neurology, subsequently referred to the hospitalist service. Seen by neuro hospitalist team, and started on IV Ig and Mestinon. MRI brain negative, acetylcholine receptor antibodies and striated muscle antibodies negative. Unfortunately no significant response to IVIG, plans are now for plasma exchange. Okay to transfer to CIR if plasmapheresis could be continued while at CIR    Dysphagia: Seen by speech therapy, current recommendations are dysphagia 2 diet. Cautiously continue.    Dehydration: Resolved with IV fluids.    Hypertension: Continue benazepril.    Severe protein calorie malnutrition: Continue supplements    Constipation: Continue MiraLAX, one time dose of milk of magnesia. If no response, may need enema.    BPH: Continue Proscar  Disposition: Remain inpatient-CIR vs SNF on discharge  Antibiotics:  See below   Anti-infectives    None      DVT Prophylaxis: Prophylactic Heparin   Code Status: Full code   Family Communication None  Procedures:  None  CONSULTS:  neurology  MEDICATIONS: Scheduled Meds: . benazepril  20 mg Oral Daily  . feeding supplement (ENSURE COMPLETE)  237 mL Oral BID BM  . finasteride  5 mg Oral Daily  . heparin  3,000 Units Intracatheter Once  . heparin  5,000 Units Subcutaneous 3 times per day  . polyethylene glycol  17 g Oral BID  . pyridostigmine  60 mg Oral 3 times per day  . sodium chloride  1,000 mL Intravenous Once  . sodium chloride  3 mL Intravenous Q12H   Continuous Infusions:   PRN  Meds:.acetaminophen **OR** acetaminophen, alum & mag hydroxide-simeth, ondansetron **OR** ondansetron (ZOFRAN) IV, oxyCODONE, sodium phosphate    PHYSICAL EXAM: Vital signs in last 24 hours: Filed Vitals:   06/16/14 0546 06/16/14 1351 06/16/14 2224 06/17/14 0536  BP: 151/62 138/68 168/101 152/70  Pulse: 66 78 89 65  Temp: 97.8 F (36.6 C) 98.5 F (36.9 C) 98.3 F (36.8 C) 98.3 F (36.8 C)  TempSrc: Oral Axillary Oral Oral  Resp: Height:      Weight:      SpO2: 93% 94% 95% 96%    Weight change:  Filed Weights   06/12/14 1223  Weight: 59 kg (130 lb 1.1 oz)   Body mass index is 19.2 kg/(m^2).   Gen Exam: Awake and alert with clear speech.  Left eye ptosis. Not in acute distress Neck: Supple, No JVD.   Chest: B/L Clear.   no rales  CVS: S1 S2 Regular, no murmurs.  Abdomen: soft, BS +, non tender, non distended.  Extremities: no edema, lower extremities warm to touch. Neurologic: Non Focal. But with generalized weakness. Skin: No Rash.   Wounds: N/A.   Intake/Output from previous day:  Intake/Output Summary (Last 24 hours) at 06/17/14 1116 Last data filed at 06/17/14 0600  Gross per 24 hour  Intake    360 ml  Output    550 ml  Net   -190 ml     LAB RESULTS: CBC  Recent Labs Lab 06/12/14 1331 06/13/14 1010 06/14/14 0550  WBC 4.4 4.3 4.9  HGB 11.8* 12.4* 12.7*  HCT 37.6* 39.2  40.3  PLT 141* 154 150  MCV 93.8 93.3 94.6  MCH 29.4 29.5 29.8  MCHC 31.4 31.6 31.5  RDW 16.1* 16.2* 16.5*    Chemistries   Recent Labs Lab 06/12/14 1331 06/13/14 1010 06/14/14 0550  NA 139 139 137  K 4.0 4.0 4.1  CL 105 107 108  CO2 25 22 26   GLUCOSE 93 132* 122*  BUN 33* 29* 24*  CREATININE 1.49* 1.37* 1.30  CALCIUM 9.8 9.5 9.3    CBG: No results for input(s): GLUCAP in the last 168 hours.  GFR Estimated Creatinine Clearance: 35.9 mL/min (by C-G formula based on Cr of 1.3).  Coagulation profile  Recent Labs Lab 06/12/14 1331  INR 1.17     Cardiac Enzymes No results for input(s): CKMB, TROPONINI, MYOGLOBIN in the last 168 hours.  Invalid input(s): CK  Invalid input(s): POCBNP No results for input(s): DDIMER in the last 72 hours. No results for input(s): HGBA1C in the last 72 hours. No results for input(s): CHOL, HDL, LDLCALC, TRIG, CHOLHDL, LDLDIRECT in the last 72 hours. No results for input(s): TSH, T4TOTAL, T3FREE, THYROIDAB in the last 72 hours.  Invalid input(s): FREET3 No results for input(s): VITAMINB12, FOLATE, FERRITIN, TIBC, IRON, RETICCTPCT in the last 72 hours. No results for input(s): LIPASE, AMYLASE in the last 72 hours.  Urine Studies No results for input(s): UHGB, CRYS in the last 72 hours.  Invalid input(s): UACOL, UAPR, USPG, UPH, UTP, UGL, UKET, UBIL, UNIT, UROB, ULEU, UEPI, UWBC, URBC, UBAC, CAST, UCOM, BILUA  MICROBIOLOGY: No results found for this or any previous visit (from the past 240 hour(s)).  RADIOLOGY STUDIES/RESULTS: Mr Sherrin DaisyBrain Wo Contrast  06/13/2014   CLINICAL DATA:  Progressive generalized weakness with diplopia, dysphagia, and bilateral ptosis. Suspected myasthenia gravis. Initial evaluation.  EXAM: MRI HEAD WITHOUT CONTRAST  TECHNIQUE: Multiplanar, multiecho pulse sequences of the brain and surrounding structures were obtained without intravenous contrast.  COMPARISON:  None.  FINDINGS: Diffuse prominence of the CSF containing spaces is compatible with generalized cerebral atrophy. No significant white matter changes present. No chronic infarction.  No abnormal foci of restricted diffusion to suggest acute intracranial infarct. Gray-white matter differentiation maintained. Normal intravascular flow voids present. No acute or chronic intracranial hemorrhage.  No mass lesion or midline shift. No hydrocephalus. No extra-axial fluid collection.  Craniocervical junction normal. Pituitary gland within normal limits. No acute abnormality seen about the orbits.  Paranasal sinuses and mastoid air  cells are clear.  Mild degenerative changes noted within the upper cervical spine. Bone marrow signal intensity normal. Scalp soft tissues within normal limits.  IMPRESSION: 1. No acute intracranial abnormality. 2. Moderate generalized cerebral atrophy.   Electronically Signed   By: Rise MuBenjamin  McClintock M.D.   On: 06/13/2014 00:49   Dg Chest Port 1 View  06/16/2014   CLINICAL DATA:  81 year old male status post central line placement, positioning. Initial encounter.  EXAM: PORTABLE CHEST - 1 VIEW  COMPARISON:  06/12/2014 and earlier.  FINDINGS: Portable AP upright view at 1535 hours. Left IJ approach central line now in place. Tip projects at the cavoatrial junction level. Lower lung volumes. Stable cardiac size and mediastinal contours. No pneumothorax. Increased interstitial opacity, in part felt due to crowding. Increased retrocardiac opacity, Suggestion of small left pleural effusion.  IMPRESSION: 1. Left IJ approach central line placed, tip at the cavoatrial junction level. 2. Lower lung volumes with increased crowding of markings and possible vascular congestion. 3. Increased lung base opacity suspected due to left pleural effusion.  Electronically Signed   By: Odessa Fleming M.D.   On: 06/16/2014 15:54   Dg Chest Port 1 View  06/12/2014   CLINICAL DATA:  Cough. Questionable aspiration. Generalized weakness.  EXAM: PORTABLE CHEST - 1 VIEW  COMPARISON:  Chest CT, 12/17/2013.  FINDINGS: The heart size and mediastinal contours are within normal limits. Both lungs are clear. The visualized skeletal structures are unremarkable.  IMPRESSION: No active disease.   Electronically Signed   By: Amie Portland M.D.   On: 06/12/2014 16:01   Dg Swallowing Func-speech Pathology  06/13/2014    Objective Swallowing Evaluation:    Patient Details  Name: [name] MRN: [id number] Date of Birth: [birth year]  Today's Date: 06/13/2014 Time: SLP Start Time (ACUTE ONLY): 0815-SLP Stop Time (ACUTE ONLY): 0850 SLP Time Calculation  (min) (ACUTE ONLY): 35 min  Past Medical History:  Past Medical History  Diagnosis Date  . Hypertension   . Carotid artery disease   . Weight loss, unintentional   . Protein-calorie malnutrition, moderate    Past Surgical History:  Past Surgical History  Procedure Laterality Date  . Hernia repair      Bilateral  . Prostate biopsy      WNL  . Hemorrhoid surgery    . Cataract extraction     HPI:  HPI: Lysle Yero Baggerly is a 81 y.o. male with a past medical history of  hypertension, gastroesophageal reflux disease, presenting as a transfer  from his neurologist office. Patient having progressive generalized  weakness that started 6 months to 1 year ago, that has been associated  with diplopia, dysphasia, and bilateral ptosis. Patient states that  symptoms occurring throughout the day however on occasion have been worse  at nighttime. He reports having multiple falls in in the past with his  last fall being one month ago. He also reports a 20 pound weight loss  throughout this period, attributing this to minimal by mouth intake  stating he cannot have solid foods. He was evaluated by his neurologist on  day of admit felt that symptoms likely secondary to myasthenia gravis.   MRI brain and CXR negative.    No Data Recorded  Assessment / Plan / Recommendation CHL IP CLINICAL IMPRESSIONS 06/13/2014  Dysphagia Diagnosis Moderate oral phase dysphagia;Severe pharyngeal phase  dysphagia;Severe cervical esophageal phase dysphagia  Clinical impression Moderate oral and severe pharyngo-cervical esophageal  dysphagia with significant weak muscular contraction.  Said weakness  results in severe pharyngeal stasis across all consistencies that mix with  secretions retained.  Liquid swallows faciliated clearance but resulted in  mild aspiration of thin with sensation.  Pt is able to expectorate to  clear vallecular residuals he cannot clear into esophagus.  Pt  expectorated viscous secretions throughout entire evaluation-retained   secretions he is unable to swallow.   Pt is a high aspiration risk with  all intake but apparently has been tolerating given his negative CXR.  He  does report issues with excessive secretions for the last few months.  MD  may desire to allow pt to continue po intake.  Concern for pt to be able  to support self nutritionally at this time due to level of dysphagia.        CHL IP TREATMENT RECOMMENDATION 06/13/2014  Treatment Plan Recommendations Therapy as outlined in treatment plan below      CHL IP DIET RECOMMENDATION 06/13/2014  Diet Recommendations Ice chips PRN after oral care;Dysphagia 2 (Fine  chop);Thin liquid  Liquid Administration via  Cup;Straw  Medication Administration Crushed with puree  Compensations Slow rate;Small sips/bites;Multiple dry swallows after each  bite/sip;Follow solids with liquid;Effortful swallow;Hard cough after  swallow  Postural Changes and/or Swallow Maneuvers Seated upright 90  degrees;Upright 30-60 min after meal     CHL IP OTHER RECOMMENDATIONS 06/13/2014  Recommended Consults (None)  Oral Care Recommendations Oral care BID  Other Recommendations (None)     CHL IP FOLLOW UP RECOMMENDATIONS 06/13/2014  Follow up Recommendations None     CHL IP FREQUENCY AND DURATION 06/13/2014  Speech Therapy Frequency (ACUTE ONLY) (None)  Treatment Duration 2 weeks      CHL IP REASON FOR REFERRAL 06/13/2014  Reason for Referral Objectively evaluate swallowing function     CHL IP ORAL PHASE 06/13/2014  Lips (None)  Tongue (None)  Mucous membranes (None)  Nutritional status (None)  Other (None)  Oxygen therapy (None)  Oral Phase Impaired  Oral - Pudding Teaspoon (None)  Oral - Pudding Cup (None)  Oral - Honey Teaspoon (None)  Oral - Honey Cup (None)  Oral - Honey Syringe (None)  Oral - Nectar Teaspoon (None)  Oral - Nectar Cup Weak lingual manipulation;Delayed oral transit;Reduced  posterior propulsion;Piecemeal swallowing  Oral - Nectar Straw (None)  Oral - Nectar Syringe (None)  Oral - Ice Chips (None)   Oral - Thin Teaspoon Weak lingual manipulation;Delayed oral  transit;Reduced posterior propulsion;Piecemeal swallowing  Oral - Thin Cup Weak lingual manipulation;Delayed oral transit;Reduced  posterior propulsion;Piecemeal swallowing  Oral - Thin Straw (None)  Oral - Thin Syringe (None)  Oral - Puree Weak lingual manipulation;Delayed oral transit;Reduced  posterior propulsion  Oral - Mechanical Soft Weak lingual manipulation;Delayed oral  transit;Reduced posterior propulsion  Oral - Regular (None)  Oral - Multi-consistency (None)  Oral - Pill (None)  Oral Phase - Comment (None)      CHL IP PHARYNGEAL PHASE 06/13/2014  Pharyngeal Phase Impaired  Pharyngeal - Pudding Teaspoon (None)  Penetration/Aspiration details (pudding teaspoon) (None)  Pharyngeal - Pudding Cup (None)  Penetration/Aspiration details (pudding cup) (None)  Pharyngeal - Honey Teaspoon (None)  Penetration/Aspiration details (honey teaspoon) (None)  Pharyngeal - Honey Cup (None)  Penetration/Aspiration details (honey cup) (None)  Pharyngeal - Honey Syringe (None)  Penetration/Aspiration details (honey syringe) (None)  Pharyngeal - Nectar Teaspoon (None)  Penetration/Aspiration details (nectar teaspoon) (None)  Pharyngeal - Nectar Cup Reduced pharyngeal peristalsis;Reduced epiglottic  inversion;Reduced tongue base retraction;Reduced airway/laryngeal  closure;Reduced laryngeal elevation;Pharyngeal residue -  valleculae;Pharyngeal residue - pyriform sinuses;Reduced anterior  laryngeal mobility  Penetration/Aspiration details (nectar cup) (None)  Pharyngeal - Nectar Straw (None)  Penetration/Aspiration details (nectar straw) (None)  Pharyngeal - Nectar Syringe (None)  Penetration/Aspiration details (nectar syringe) (None)  Pharyngeal - Ice Chips (None)  Penetration/Aspiration details (ice chips) (None)  Pharyngeal - Thin Teaspoon Reduced pharyngeal peristalsis;Reduced  epiglottic inversion;Reduced tongue base retraction;Reduced  airway/laryngeal  closure;Reduced laryngeal  elevation;Penetration/Aspiration during swallow;Pharyngeal residue -  valleculae;Pharyngeal residue - pyriform sinuses;Reduced anterior  laryngeal mobility  Penetration/Aspiration details (thin teaspoon) Material enters airway,  remains ABOVE vocal cords and not ejected out  Pharyngeal - Thin Cup Reduced pharyngeal peristalsis;Reduced epiglottic  inversion;Pharyngeal residue - valleculae;Pharyngeal residue - pyriform  sinuses;Trace aspiration;Penetration/Aspiration during  swallow;Penetration/Aspiration after swallow;Reduced anterior laryngeal  mobility  Penetration/Aspiration details (thin cup) Material enters airway, passes  BELOW cords and not ejected out despite cough attempt by patient  Pharyngeal - Thin Straw (None)  Penetration/Aspiration details (thin straw) (None)  Pharyngeal - Thin Syringe (None)  Penetration/Aspiration details (thin syringe') (None)  Pharyngeal - Puree Reduced  pharyngeal peristalsis;Reduced epiglottic  inversion;Pharyngeal residue - valleculae;Reduced tongue base  retraction;Reduced airway/laryngeal closure;Reduced laryngeal  elevation;Reduced anterior laryngeal mobility  Penetration/Aspiration details (puree) (None)  Pharyngeal - Mechanical Soft Reduced pharyngeal peristalsis;Reduced  epiglottic inversion;Pharyngeal residue - valleculae;Reduced laryngeal  elevation;Reduced airway/laryngeal closure;Reduced tongue base  retraction;Reduced anterior laryngeal mobility  Penetration/Aspiration details (mechanical soft) (None)  Pharyngeal - Regular (None)  Penetration/Aspiration details (regular) (None)  Pharyngeal - Multi-consistency (None)  Penetration/Aspiration details (multi-consistency) (None)  Pharyngeal - Pill (None)  Penetration/Aspiration details (pill) (None)  Pharyngeal Comment pharyngeal residuals *(mixing with viscous secretions)  across consistencies noted with pt awareness - liquid swallows faciliated  clearance but resulted in mild aspiration, pt is  able to expectorate to  clear vallecular residuals he does not swallow     CHL IP CERVICAL ESOPHAGEAL PHASE 06/13/2014  Cervical Esophageal Phase Impaired                    Nectar Cup Reduced cricopharyngeal relaxation        Thin Teaspoon Reduced cricopharyngeal relaxation                                                                                                          Mills Koller, MS [facility] SLP [phone]     Jeoffrey Massed, MD  [facility] Pager:[phone]  If 7PM-7AM, please contact night-coverage [email or web address] Password [facility] 06/17/2014, 11:16 AM   LOS: 5 days

## 2014-06-17 NOTE — Care Management Note (Signed)
    Page 1 of 1   06/18/2014     11:05:59 AM CARE MANAGEMENT NOTE 06/18/2014  Patient:  [name],Ronald W   Account Number:  [id number]  Date Initiated:  06/17/2014  Documentation initiated by:  Letha CapeAYLOR,DEBORAH  Subjective/Objective Assessment:   dx myasthenia gravis  admit- lives alone.     Action/Plan:   pt eval- rec CIR  ivig  Therepeutic Plasma Exchange for 5 txts.   Anticipated DC Date:  06/21/2014   Anticipated DC Plan:  IP REHAB FACILITY  In-house referral  Clinical Social Worker      DC Planning Services  CM consult      Choice offered to / List presented to:             Status of service:  Completed, signed off Medicare Important Message given?  YES (If response is "NO", the following Medicare IM given date fields will be blank) Date Medicare IM given:  06/16/2014 Medicare IM given by:  Letha CapeAYLOR,DEBORAH Date Additional Medicare IM given:   Additional Medicare IM given by:    Discharge Disposition:  IP REHAB FACILITY  Per UR Regulation:  Reviewed for med. necessity/level of care/duration of stay  If discussed at Long Length of Stay Meetings, dates discussed:   06/17/2014    Comments:  06-18-14 Tomi BambergerBrenda Graves-Bigelow, RN,BSN [phone] Plan for d/c to CIR today. No further needs from CM at this time.  06/17/14 1218 Letha Capeeborah Taylor RN BSN [phone] patient has myasthenia gravis, ivig txts with no improvement, now starting TPE for 5 txts,  CIR per CIR rep they want to see how patient does after his first txt of TPE and how well he works with physical therapy, right now patient is ambulating 180 feet with min guard ast.  CSW is also following for back up for snf.

## 2014-06-17 NOTE — Progress Notes (Addendum)
NUTRITION FOLLOW UP  Pt meets criteria for severe MALNUTRITION in the context of chronic illness as evidenced by 19.8% wt loss x 6 months, severe fat and muscle depletion.  Intervention:   -Continue with Ensure Complete po BID, each supplement provides 350 kcal and 13 grams of protein -Add Boost Plus po BID, each supplement provides 360 kcals and 14 grams protein  Nutrition Dx:   Malnutrition related to decreased oral intake, dysphagia as evidenced by diet hx, moderate to severe fat and muscle depletion; ongoing  Goal:   Pt will meet >90% of estimated nutritional needs; unmet  Monitor:   PO/supplement intake, labs, weight changes, I/O's  Assessment:   [name] is a 81 y.o. male presenting with fatigable bilateral ptosis, binocular double vision, proximal weakness, swallowing difficulty.   Pt s/p HD cath insertion on 06/16/14 for plasmapheresis, due to minimal response to IVIG. Pt is constipated; no BM since 06/13/14. He is on a bowel regimen with possibility for enema.  SLP is following; pt has been upgraded to a dysphagia 2 diet with thin liquids. Pt reports his appetite is still very poor and doesn't feel like eating solid food. Noted 25% meal completion. Noted Ensure at bedside table. He reports that he drinks them whenever offered and takes liquids well. He reveals that he lived off Ensure and Boost supplements PTA and would often rotate them to prevent monotony. He requests Boost supplements as well as Ensure (drinking 2-4 supplements per day). Discussed importance of good meal and supplement intake to support healing. Encouraged continued use of supplements, especially during times of poor appetite. Discharge disposition is CIR vs SNF.  Labs reviewed. Na: 132, BUN: 27, Glucose: 123.   Height: Ht Readings from Last 1 Encounters:  06/12/14 5\' 9"  (1.753 m)    Weight Status:   Wt Readings from Last 1 Encounters:  06/12/14 130 lb 1.1 oz (59 kg)    Re-estimated needs:   Kcal: 1750-1950 Protein: 71-81 grams Fluid: 1.7-1.9 L  Skin: WDL  Diet Order: DIET DYS 2   Intake/Output Summary (Last 24 hours) at 06/17/14 1151 Last data filed at 06/17/14 1148  Gross per 24 hour  Intake    360 ml  Output    650 ml  Net   -290 ml    Last BM: 06/13/14   Labs:   Recent Labs Lab 06/13/14 1010 06/14/14 0550 06/17/14 0924  NA 139 137 132*  K 4.0 4.1 4.4  CL 107 108 103  CO2 22 26 26   BUN 29* 24* 27*  CREATININE 1.37* 1.30 1.31  CALCIUM 9.5 9.3 8.9  GLUCOSE 132* 122* 123*    CBG (last 3)  No results for input(s): GLUCAP in the last 72 hours.  Scheduled Meds: . benazepril  20 mg Oral Daily  . feeding supplement (ENSURE COMPLETE)  237 mL Oral BID BM  . finasteride  5 mg Oral Daily  . heparin  3,000 Units Intracatheter Once  . heparin  5,000 Units Subcutaneous 3 times per day  . polyethylene glycol  17 g Oral BID  . pyridostigmine  60 mg Oral 3 times per day  . sodium chloride  1,000 mL Intravenous Once  . sodium chloride  3 mL Intravenous Q12H    Continuous Infusions:   [name], RD, LDN, CDE Pager: [phone] After hours Pager: [phone]

## 2014-06-18 ENCOUNTER — Inpatient Hospital Stay (HOSPITAL_COMMUNITY)
Admission: AD | Admit: 2014-06-18 | Discharge: 2014-06-19 | DRG: 057 | Disposition: A | Discharge status: Other Acute Inpatient Hospital | Payer: Medicare Other | Source: Intra-hospital | Attending: Physical Medicine & Rehabilitation | Admitting: Physical Medicine & Rehabilitation

## 2014-06-18 ENCOUNTER — Encounter (HOSPITAL_COMMUNITY): Payer: Self-pay | Admitting: *Deleted

## 2014-06-18 DIAGNOSIS — I4891 Unspecified atrial fibrillation: Secondary | ICD-10-CM | POA: Diagnosis present

## 2014-06-18 DIAGNOSIS — N400 Enlarged prostate without lower urinary tract symptoms: Secondary | ICD-10-CM | POA: Diagnosis present

## 2014-06-18 DIAGNOSIS — F329 Major depressive disorder, single episode, unspecified: Secondary | ICD-10-CM | POA: Diagnosis present

## 2014-06-18 DIAGNOSIS — K219 Gastro-esophageal reflux disease without esophagitis: Secondary | ICD-10-CM | POA: Diagnosis present

## 2014-06-18 DIAGNOSIS — J069 Acute upper respiratory infection, unspecified: Secondary | ICD-10-CM | POA: Diagnosis present

## 2014-06-18 DIAGNOSIS — R0602 Shortness of breath: Secondary | ICD-10-CM

## 2014-06-18 DIAGNOSIS — G7000 Myasthenia gravis without (acute) exacerbation: Secondary | ICD-10-CM | POA: Diagnosis present

## 2014-06-18 DIAGNOSIS — I739 Peripheral vascular disease, unspecified: Secondary | ICD-10-CM | POA: Diagnosis present

## 2014-06-18 DIAGNOSIS — I10 Essential (primary) hypertension: Secondary | ICD-10-CM | POA: Diagnosis present

## 2014-06-18 DIAGNOSIS — R1310 Dysphagia, unspecified: Secondary | ICD-10-CM | POA: Diagnosis present

## 2014-06-18 DIAGNOSIS — R531 Weakness: Secondary | ICD-10-CM | POA: Diagnosis present

## 2014-06-18 DIAGNOSIS — J9690 Respiratory failure, unspecified, unspecified whether with hypoxia or hypercapnia: Secondary | ICD-10-CM

## 2014-06-18 LAB — CREATININE, SERUM
CREATININE: 1.3 mg/dL (ref 0.50–1.35)
GFR, EST AFRICAN AMERICAN: 57 mL/min — AB (ref >90)
GFR, EST NON AFRICAN AMERICAN: 49 mL/min — AB (ref >90)

## 2014-06-18 LAB — CBC
HCT: 37.2 % — ABNORMAL LOW (ref 39.0–52.0)
HEMOGLOBIN: 12.1 g/dL — AB (ref 13.0–17.0)
MCH: 30.3 pg (ref 26.0–34.0)
MCHC: 32.5 g/dL (ref 30.0–36.0)
MCV: 93 fL (ref 78.0–100.0)
PLATELETS: 133 10*3/uL — AB (ref 150–400)
RBC: 4 MIL/uL — AB (ref 4.22–5.81)
RDW: 16.5 % — ABNORMAL HIGH (ref 11.5–15.5)
WBC: 8.1 10*3/uL (ref 4.0–10.5)

## 2014-06-18 MED ORDER — PYRIDOSTIGMINE BROMIDE 60 MG PO TABS
90.0000 mg | ORAL_TABLET | Freq: Three times a day (TID) | ORAL | Status: DC
  Administered 2014-06-18: 90 mg via ORAL
  Filled 2014-06-18 (×3): qty 1.5

## 2014-06-18 MED ORDER — BOOST PLUS PO LIQD: 237.0000 mL | Freq: Two times a day (BID) | ORAL | Status: AC

## 2014-06-18 MED ORDER — OXYCODONE HCL 5 MG PO TABS
5.0000 mg | ORAL_TABLET | ORAL | Status: DC | PRN
  Administered 2014-06-18: 5 mg via ORAL
  Filled 2014-06-18: qty 1

## 2014-06-18 MED ORDER — FINASTERIDE 5 MG PO TABS
5.0000 mg | ORAL_TABLET | Freq: Every day | ORAL | Status: DC
Start: 2014-06-19 — End: 2014-06-19
  Filled 2014-06-18 (×2): qty 1

## 2014-06-18 MED ORDER — PYRIDOSTIGMINE BROMIDE 60 MG PO TABS: 60.0000 mg | ORAL_TABLET | Freq: Three times a day (TID) | ORAL | Status: AC

## 2014-06-18 MED ORDER — SORBITOL 70 % SOLN
30.0000 mL | Freq: Every day | Status: DC | PRN
  Administered 2014-06-19: 30 mL via ORAL
  Filled 2014-06-18: qty 30

## 2014-06-18 MED ORDER — ENSURE COMPLETE PO LIQD: 237.0000 mL | Freq: Two times a day (BID) | ORAL | Status: DC

## 2014-06-18 MED ORDER — POLYETHYLENE GLYCOL 3350 17 G PO PACK
17.0000 g | PACK | Freq: Two times a day (BID) | ORAL | Status: DC
  Administered 2014-06-18: 17 g via ORAL
  Filled 2014-06-18 (×4): qty 1

## 2014-06-18 MED ORDER — BOOST PLUS PO LIQD
237.0000 mL | Freq: Two times a day (BID) | ORAL | Status: DC
  Filled 2014-06-18 (×3): qty 237

## 2014-06-18 MED ORDER — ENSURE COMPLETE PO LIQD: 237.0000 mL | Freq: Two times a day (BID) | ORAL | Status: AC

## 2014-06-18 MED ORDER — ACETAMINOPHEN 325 MG PO TABS: 325.0000 mg | ORAL_TABLET | ORAL | Status: DC | PRN

## 2014-06-18 MED ORDER — ONDANSETRON HCL 4 MG/2ML IJ SOLN: 4.0000 mg | Freq: Four times a day (QID) | INTRAMUSCULAR | Status: DC | PRN

## 2014-06-18 MED ORDER — ONDANSETRON HCL 4 MG PO TABS: 4.0000 mg | ORAL_TABLET | Freq: Four times a day (QID) | ORAL | Status: DC | PRN

## 2014-06-18 MED ORDER — HEPARIN SODIUM (PORCINE) 5000 UNIT/ML IJ SOLN
5000.0000 [IU] | Freq: Three times a day (TID) | INTRAMUSCULAR | Status: DC
  Administered 2014-06-18 – 2014-06-19 (×2): 5000 [IU] via SUBCUTANEOUS
  Filled 2014-06-18 (×5): qty 1

## 2014-06-18 MED ORDER — BENAZEPRIL HCL 20 MG PO TABS
20.0000 mg | ORAL_TABLET | Freq: Every day | ORAL | Status: DC
Start: 2014-06-19 — End: 2014-06-19
  Administered 2014-06-19: 20 mg via ORAL
  Filled 2014-06-18 (×2): qty 1

## 2014-06-18 MED ORDER — POLYETHYLENE GLYCOL 3350 17 G PO PACK: 17.0000 g | PACK | Freq: Two times a day (BID) | ORAL | Status: AC

## 2014-06-18 MED ORDER — HEPARIN SODIUM (PORCINE) 5000 UNIT/ML IJ SOLN: 5000.0000 [IU] | Freq: Three times a day (TID) | INTRAMUSCULAR | Status: DC

## 2014-06-18 NOTE — Progress Notes (Signed)
Pt morning Bp 176/3393mmHg, Schorr,NP made aware. Awaiting for orders. We will continue to monitor.

## 2014-06-18 NOTE — Progress Notes (Signed)
Subjective: Patient stating he feels slightly stronger but "not a whole lot".  No complaints of diplopia, SOB or abdominal discomfort. Tolerated first TPE well.  This morning with difficulty secondary to secretions.  Unable to clear secondary to a weak cough.     Objective: Current vital signs: BP 176/93 mmHg  Pulse 64  Temp(Src) 98.5 F (36.9 C) (Oral)  Resp 19  Ht  (1.753 m)  Wt 61.4 kg (135 lb 5.8 oz)  BMI 19.98 kg/m2  SpO2 100% Vital signs in last 24 hours: Temp:  [96.7 F (35.9 C)-98.5 F (36.9 C)] 98.5 F (36.9 C) (02/24 0542) Pulse Rate:  [63-144] 64 (02/24 0606) Resp:  [15-36] 19 (02/24 0542) BP: (134-197)/(49-98) 176/93 mmHg (02/24 0606) SpO2:  [99 %-100 %] 100 % (02/24 0606) Weight:  [61.4 kg (135 lb 5.8 oz)] 61.4 kg (135 lb 5.8 oz) (02/23 1450)  Intake/Output from previous day: 02/23 0701 - 02/24 0700 In: 1070 [P.O.:280; IV Piggyback:790] Out: 750 [Urine:750] Intake/Output this shift:   Nutritional status: DIET DYS 2  Neurologic Exam: Mental Status: Alert, oriented to [place], year of 2016 and moth of February.  Speech fluent without evidence of aphasia. Able to follow 3 step commands without difficulty. Cranial Nerves: II: Visual fields grossly normal, pupils equal, round, reactive to light and accommodation III,IV, VI: ptosis present left eye. Extra ocular motions intact bilaterally V,VII: smile symmetric, facial light touch sensation normal bilaterally VIII: hearing normal bilaterally IX,X: gag reflex present XI: bilateral shoulder shrug XII: midline tongue extension without atrophy or fasciculations  Motor: Right :Upper extremity 4-/5 deltoidsLeft: Upper extremity 4-/5 deltoids 4/5 bicep/tricep4/5 bicep/tricep   4/5 Hand grip4/5 hand grip Lower extremity 4-/5        Lower extremity 4-/5 Tone and bulk:normal tone throughout; no atrophy noted Sensory: Pinprick and light touch intact throughout, bilaterally Deep Tendon Reflexes:  1+ throughout with no AJ Plantars: Right: downgoingLeft: downgoing Cerebellar: normal finger-to-nose,   Lab Results: Basic Metabolic Panel:  Recent Labs Lab 06/12/14 1331 06/13/14 1010 06/14/14 0550 06/17/14 0924 06/17/14 1503  NA 139 139 137 132* 136  K 4.0 4.0 4.1 4.4 4.3  CL 105 107 108 103 99  CO2 --   GLUCOSE 93 132* 122* 123* 100*  BUN 33* 29* 24* 27* 29*  CREATININE 1.49* 1.37* 1.30 1.31 1.40*  CALCIUM 9.8 9.5 9.3 8.9  --     Liver Function Tests:  Recent Labs Lab 06/12/14 1331 06/17/14 0924  AST 113* 86*  ALT 75* 61*  ALKPHOS 36* 39  BILITOT 0.7 0.9  PROT 6.2 6.8  ALBUMIN 2.5* 2.1*   No results for input(s): LIPASE, AMYLASE in the last 168 hours. No results for input(s): AMMONIA in the last 168 hours.  CBC:  Recent Labs Lab 06/12/14 1331 06/13/14 1010 06/14/14 0550 06/17/14 1503 06/17/14 1630  WBC 4.4 4.3 4.9  --  4.8  HGB 11.8* 12.4* 12.7* 13.6 12.1*  HCT 37.6* 39.2 40.3 40.0 37.2*  MCV 93.8 93.3 94.6  --  91.6  PLT 141* 154 150  --  155    Cardiac Enzymes: No results for input(s): CKTOTAL, CKMB, CKMBINDEX, TROPONINI in the last 168 hours.  Lipid Panel: No results for input(s): CHOL, TRIG, HDL, CHOLHDL, VLDL, LDLCALC in the last 168 hours.  CBG: No results for input(s): GLUCAP in the last 168 hours.  Microbiology: No results found for this or any previous visit.  Coagulation Studies: No results for input(s): LABPROT, INR  in the last 72  hours.  Imaging: Dg Chest Port 1 View  06/16/2014   CLINICAL DATA:  81 year old male status post central line placement, positioning. Initial encounter.  EXAM: PORTABLE CHEST - 1 VIEW  COMPARISON:  06/12/2014 and earlier.  FINDINGS: Portable AP upright view at 1535 hours. Left IJ approach central line now in place. Tip projects at the cavoatrial junction level. Lower lung volumes. Stable cardiac size and mediastinal contours. No pneumothorax. Increased interstitial opacity, in part felt due to crowding. Increased retrocardiac opacity, Suggestion of small left pleural effusion.  IMPRESSION: 1. Left IJ approach central line placed, tip at the cavoatrial junction level. 2. Lower lung volumes with increased crowding of markings and possible vascular congestion. 3. Increased lung base opacity suspected due to left pleural effusion.   Electronically Signed   By: Odessa FlemingH  Hall M.D.   On: 06/16/2014 15:54    Medications:  Scheduled: . benazepril  20 mg Oral Daily  . feeding supplement (ENSURE COMPLETE)  237 mL Oral BID BM  . finasteride  5 mg Oral Daily  . heparin  3,000 Units Intracatheter Once  . heparin  5,000 Units Subcutaneous 3 times per day  . lactose free nutrition  237 mL Oral BID WC  . polyethylene glycol  17 g Oral BID  . pyridostigmine  60 mg Oral 3 times per day  . sodium chloride  1,000 mL Intravenous Once  . sodium chloride  3 mL Intravenous Q12H   Felicie MornDavid Smith PA-C [facility] [phone]  06/18/2014, 9:05 AM  Patient seen and examined.  Clinical course and management discussed.  Necessary edits performed.  I agree with the above.  Assessment and plan of care developed and discussed below.    Assessment/Plan: 81 YO male admitted for management of MG. He has received 4/5 IVIg treatments with no significantimprovement noted.Bulbar muscles remain significantly impaired.  Antibodies for striated muscle and binding Ach receptor antibodies were negative. Remainder of myasthenia  gravis panel 2 is pending. Underwent first TPE treatment yesterday and will have 2nd on 2/25.  To go to CIR today.  NIF and VC from yesterday normal.    Recommendations: 1.  Increase Mestinon to 90 mg TID 2.  Continue TPE.     Thana FarrLeslie Deaire Mcwhirter, MD [facility] [phone]  06/18/2014  11:25 AM

## 2014-06-18 NOTE — Interval H&P Note (Signed)
[name] was admitted today to Inpatient Rehabilitation with the diagnosis of myasthenia gravis.  The patient's history has been reviewed, patient examined, and there is no change in status.  Patient continues to be appropriate for intensive inpatient rehabilitation.  I have reviewed the patient's chart and labs.  Questions were answered to the patient's satisfaction.  SWARTZ,ZACHARY T 06/18/2014, 7:02 PM

## 2014-06-18 NOTE — Progress Notes (Signed)
Physical Therapy Treatment Patient Details Name: [name] MRN: [id number] DOB: 1928/10/19 Today's Date: 06/18/2014    History of Present Illness Pt is an 81 y.o. Male admitted 06/12/14 from his neurologist office due to progressive generalized weakness and associated diplopia, dysphagia, ptosis. Pt has had multiple falls at home. Neurologist admitted pt due to symptoms likely 2/2 myasthenia gravis. PMH: HTN, GERD. Pt now being treated for myasthenia gravis on IVIG and Mestinon. MRI brain showed no acute findings. MBS by SLP showed dysphagia.     PT Comments    Pt appearing more fatigued today, and although he has flat affect, appeared more flat today.  Did not recall therapist from previous sessions and believes it is [id number], but oriented otherwise when given increased time for answers. States "I am tired before I even get up".  Pt able to ambulate 100', but less than the other day and with subjective complaints of fatigue.    Of note- from last session Berg balance assessment was completed, but did not pull into progress note. He had scored 27/56 indicating high fall risk.  Follow Up Recommendations  CIR     Equipment Recommendations  Rolling walker with 5" wheels    Recommendations for Other Services       Precautions / Restrictions Precautions Precautions: Fall Precaution Comments: pt/family report multiple falls at home Restrictions Weight Bearing Restrictions: No    Mobility  Bed Mobility               General bed mobility comments: not assessed  Transfers Overall transfer level: Needs assistance Equipment used: Rolling walker (2 wheeled) Transfers: Sit to/from Stand Sit to Stand: Min guard         General transfer comment: slow transition  Ambulation/Gait Ambulation/Gait assistance: Min guard;Min assist Ambulation Distance (Feet): 100 Feet Assistive device: Rolling walker (2 wheeled) Gait Pattern/deviations: Narrow base of support;Trunk  flexed;Step-through pattern;Decreased step length - right;Decreased step length - left Gait velocity: decreased Gait velocity interpretation: Below normal speed for age/gender General Gait Details: Cueing for positioning in RW and for managment of RW in tighter spaces in room.   Stairs            Wheelchair Mobility    Modified Rankin (Stroke Patients Only)       Balance             Standing balance-Leahy Scale: Poor                      Cognition Arousal/Alertness: Awake/alert Behavior During Therapy: Flat affect;WFL for tasks assessed/performed Overall Cognitive Status: Within Functional Limits for tasks assessed       Memory: Decreased short-term memory              Exercises      General Comments        Pertinent Vitals/Pain Pain Assessment: No/denies pain    Home Living                      Prior Function            PT Goals (current goals can now be found in the care plan section) Acute Rehab PT Goals PT Goal Formulation: With patient Time For Goal Achievement: 06/26/14 Potential to Achieve Goals: Good Progress towards PT goals: Progressing toward goals    Frequency  Min 3X/week    PT Plan Current plan remains appropriate    Co-evaluation  End of Session Equipment Utilized During Treatment: Gait belt Activity Tolerance: Patient tolerated treatment well;Patient limited by fatigue Patient left: in chair;with call bell/phone within reach;with chair alarm set     Time: 1610-9604 PT Time Calculation (min) (ACUTE ONLY): 13 min  Charges:  $Gait Training: 8-22 mins                    G Codes:      Nadyne Gariepy LUBECK 06/18/2014, 11:46 AM

## 2014-06-18 NOTE — Progress Notes (Signed)
Vital Capacity: 1.4L NIF -40  Pt had great effort.

## 2014-06-18 NOTE — Discharge Summary (Signed)
PATIENT DETAILS Name: [name] Age: 81 y.o. Sex: male Date of Birth: 09/05/1928 MRN: [id number]. Admitting Physician: Jeralyn BennettEzequiel Zamora, MD XLK:[id number],ZDGUYPCP:Burkhart,Brian, MD  Admit Date: 06/12/2014 Discharge date: 06/18/2014  Recommendations for Outpatient Follow-up:  1. Please complete plasmapheresis per direction of neurology 2. [facility] ([facility]urology aware and will round when at [facility]) 3. Periodic CBC and chemistries 4. Ensure neurology follow-up post discharge from CIR 5. Please follow myasthenia gravis panel pending at the time of discharge.  PRIMARY DISCHARGE DIAGNOSIS:  Active Problems:   Myasthenia gravis   Weakness of both legs   GERD (gastroesophageal reflux disease)   HTN (hypertension)   Protein-calorie malnutrition, severe   Aspiration into airway   Encounter for central line placement   Myasthenia      PAST MEDICAL HISTORY: Past Medical History  Diagnosis Date  . Hypertension   . Carotid artery disease   . Weight loss, unintentional   . Protein-calorie malnutrition, moderate     DISCHARGE MEDICATIONS: Current Discharge Medication List    START taking these medications   Details  !! feeding supplement, ENSURE COMPLETE, (ENSURE COMPLETE) LIQD Take 237 mLs by mouth 2 (two) times daily between meals.    !! lactose free nutrition (BOOST PLUS) LIQD Take 237 mLs by mouth 2 (two) times daily with a meal. Refills: 0    polyethylene glycol (MIRALAX / GLYCOLAX) packet Take 17 g by mouth 2 (two) times daily. Qty: 14 each, Refills: 0    pyridostigmine (MESTINON) 60 MG tablet Take 1 tablet (60 mg total) by mouth every 8 (eight) hours.     !! - Potential duplicate medications found. Please discuss with provider.    CONTINUE these medications which have NOT CHANGED   Details  aspirin 325 MG tablet Take 325 mg by mouth daily.    benazepril (LOTENSIN) 20 MG tablet Take 20 mg by mouth daily.    finasteride (PROSCAR) 5 MG tablet Take 5 mg  by mouth daily.    mirtazapine (REMERON) 15 MG tablet Take 15 mg by mouth at bedtime.    omeprazole (PRILOSEC) 20 MG capsule Take 20 mg by mouth 2 (two) times daily.        ALLERGIES:   Allergies  Allergen Reactions  . Sulfa Antibiotics Itching    BRIEF HPI:  See H&P, Labs, Consult and Test reports for all details in brief, patient is a 81 y.o. male with a past medical history of hypertension, gastroesophageal reflux disease, presenting as a transfer from his neurologist office. Patient having progressive generalized weakness that started 6 months to 1 year ago, that has been associated with diplopia, dysphasia, and bilateral ptosis. Patient was clinically diagnosed with myasthenia gravis, and admitted to the hospitalist service for IVIG treatment.  CONSULTATIONS:   neurology  PERTINENT RADIOLOGIC STUDIES: Mr Brain Wo Contrast  06/13/2014   CLINICAL DATA:  Progressive generalized weakness with diplopia, dysphagia, and bilateral ptosis. Suspected myasthenia gravis. Initial evaluation.  EXAM: MRI HEAD WITHOUT CONTRAST  TECHNIQUE: Multiplanar, multiecho pulse sequences of the brain and surrounding structures were obtained without intravenous contrast.  COMPARISON:  None.  FINDINGS: Diffuse prominence of the CSF containing spaces is compatible with generalized cerebral atrophy. No significant white matter changes present. No chronic infarction.  No abnormal foci of restricted diffusion to suggest acute intracranial infarct. Gray-white matter differentiation maintained. Normal intravascular flow voids present. No acute or chronic intracranial hemorrhage.  No mass lesion or midline shift. No hydrocephalus. No extra-axial fluid collection.  Craniocervical junction normal. Pituitary gland within normal limits. No acute abnormality seen about the orbits.  Paranasal sinuses and mastoid air cells are clear.  Mild degenerative changes noted within the upper cervical spine. Bone marrow signal intensity  normal. Scalp soft tissues within normal limits.  IMPRESSION: 1. No acute intracranial abnormality. 2. Moderate generalized cerebral atrophy.   Electronically Signed   By: Rise Mu M.D.   On: 06/13/2014 00:49   Dg Chest Port 1 View  06/16/2014   CLINICAL DATA:  81 year old male status post central line placement, positioning. Initial encounter.  EXAM: PORTABLE CHEST - 1 VIEW  COMPARISON:  06/12/2014 and earlier.  FINDINGS: Portable AP upright view at 1535 hours. Left IJ approach central line now in place. Tip projects at the cavoatrial junction level. Lower lung volumes. Stable cardiac size and mediastinal contours. No pneumothorax. Increased interstitial opacity, in part felt due to crowding. Increased retrocardiac opacity, Suggestion of small left pleural effusion.  IMPRESSION: 1. Left IJ approach central line placed, tip at the cavoatrial junction level. 2. Lower lung volumes with increased crowding of markings and possible vascular congestion. 3. Increased lung base opacity suspected due to left pleural effusion.   Electronically Signed   By: Odessa Fleming M.D.   On: 06/16/2014 15:54   Dg Chest Port 1 View  06/12/2014   CLINICAL DATA:  Cough. Questionable aspiration. Generalized weakness.  EXAM: PORTABLE CHEST - 1 VIEW  COMPARISON:  Chest CT, 12/17/2013.  FINDINGS: The heart size and mediastinal contours are within normal limits. Both lungs are clear. The visualized skeletal structures are unremarkable.  IMPRESSION: No active disease.   Electronically Signed   By: Amie Portland M.D.   On: 06/12/2014 16:01   Dg Swallowing Func-speech Pathology  06/13/2014    Objective Swallowing Evaluation:    Patient Details  Name: [name] MRN: [id number] Date of Birth: [birth year]  Today's Date: 06/13/2014 Time: SLP Start Time (ACUTE ONLY): 0815-SLP Stop Time (ACUTE ONLY): 0850 SLP Time Calculation (min) (ACUTE ONLY): 35 min  Past Medical History:  Past Medical History  Diagnosis Date  . Hypertension   .  Carotid artery disease   . Weight loss, unintentional   . Protein-calorie malnutrition, moderate    Past Surgical History:  Past Surgical History  Procedure Laterality Date  . Hernia repair      Bilateral  . Prostate biopsy      WNL  . Hemorrhoid surgery    . Cataract extraction     HPI:  HPI: [name] is a 81 y.o. male with a past medical history of  hypertension, gastroesophageal reflux disease, presenting as a transfer  from his neurologist office. Patient having progressive generalized  weakness that started 6 months to 1 year ago, that has been associated  with diplopia, dysphasia, and bilateral ptosis. Patient states that  symptoms occurring throughout the day however on occasion have been worse  at nighttime. He reports having multiple falls in in the past with his  last fall being one month ago. He also reports a 20 pound weight loss  throughout this period, attributing this to minimal by mouth intake  stating he cannot have solid foods. He was evaluated by his neurologist on  day of admit felt that symptoms likely secondary to myasthenia gravis.   MRI brain and CXR negative.    No Data Recorded  Assessment / Plan / Recommendation CHL IP CLINICAL IMPRESSIONS 06/13/2014  Dysphagia Diagnosis Moderate oral phase dysphagia;Severe pharyngeal phase  dysphagia;Severe cervical esophageal phase dysphagia  Clinical impression Moderate oral and severe pharyngo-cervical esophageal  dysphagia with significant weak muscular contraction.  Said weakness  results in severe pharyngeal stasis across all consistencies that mix with  secretions retained.  Liquid swallows faciliated clearance but resulted in  mild aspiration of thin with sensation.  Pt is able to expectorate to  clear vallecular residuals he cannot clear into esophagus.  Pt  expectorated viscous secretions throughout entire evaluation-retained  secretions he is unable to swallow.   Pt is a high aspiration risk with  all intake but apparently has been  tolerating given his negative CXR.  He  does report issues with excessive secretions for the last few months.  MD  may desire to allow pt to continue po intake.  Concern for pt to be able  to support self nutritionally at this time due to level of dysphagia.        CHL IP TREATMENT RECOMMENDATION 06/13/2014  Treatment Plan Recommendations Therapy as outlined in treatment plan below      CHL IP DIET RECOMMENDATION 06/13/2014  Diet Recommendations Ice chips PRN after oral care;Dysphagia 2 (Fine  chop);Thin liquid  Liquid Administration via Cup;Straw  Medication Administration Crushed with puree  Compensations Slow rate;Small sips/bites;Multiple dry swallows after each  bite/sip;Follow solids with liquid;Effortful swallow;Hard cough after  swallow  Postural Changes and/or Swallow Maneuvers Seated upright 90  degrees;Upright 30-60 min after meal     CHL IP OTHER RECOMMENDATIONS 06/13/2014  Recommended Consults (None)  Oral Care Recommendations Oral care BID  Other Recommendations (None)     CHL IP FOLLOW UP RECOMMENDATIONS 06/13/2014  Follow up Recommendations None     CHL IP FREQUENCY AND DURATION 06/13/2014  Speech Therapy Frequency (ACUTE ONLY) (None)  Treatment Duration 2 weeks      CHL IP REASON FOR REFERRAL 06/13/2014  Reason for Referral Objectively evaluate swallowing function     CHL IP ORAL PHASE 06/13/2014  Lips (None)  Tongue (None)  Mucous membranes (None)  Nutritional status (None)  Other (None)  Oxygen therapy (None)  Oral Phase Impaired  Oral - Pudding Teaspoon (None)  Oral - Pudding Cup (None)  Oral - Honey Teaspoon (None)  Oral - Honey Cup (None)  Oral - Honey Syringe (None)  Oral - Nectar Teaspoon (None)  Oral - Nectar Cup Weak lingual manipulation;Delayed oral transit;Reduced  posterior propulsion;Piecemeal swallowing  Oral - Nectar Straw (None)  Oral - Nectar Syringe (None)  Oral - Ice Chips (None)  Oral - Thin Teaspoon Weak lingual manipulation;Delayed oral  transit;Reduced posterior propulsion;Piecemeal  swallowing  Oral - Thin Cup Weak lingual manipulation;Delayed oral transit;Reduced  posterior propulsion;Piecemeal swallowing  Oral - Thin Straw (None)  Oral - Thin Syringe (None)  Oral - Puree Weak lingual manipulation;Delayed oral transit;Reduced  posterior propulsion  Oral - Mechanical Soft Weak lingual manipulation;Delayed oral  transit;Reduced posterior propulsion  Oral - Regular (None)  Oral - Multi-consistency (None)  Oral - Pill (None)  Oral Phase - Comment (None)      CHL IP PHARYNGEAL PHASE 06/13/2014  Pharyngeal Phase Impaired  Pharyngeal - Pudding Teaspoon (None)  Penetration/Aspiration details (pudding teaspoon) (None)  Pharyngeal - Pudding Cup (None)  Penetration/Aspiration details (pudding cup) (None)  Pharyngeal - Honey Teaspoon (None)  Penetration/Aspiration details (honey teaspoon) (None)  Pharyngeal - Honey Cup (None)  Penetration/Aspiration details (honey cup) (None)  Pharyngeal - Honey Syringe (None)  Penetration/Aspiration details (honey syringe) (None)  Pharyngeal - Nectar Teaspoon (None)  Penetration/Aspiration details (nectar teaspoon) (  None)  Pharyngeal - Nectar Cup Reduced pharyngeal peristalsis;Reduced epiglottic  inversion;Reduced tongue base retraction;Reduced airway/laryngeal  closure;Reduced laryngeal elevation;Pharyngeal residue -  valleculae;Pharyngeal residue - pyriform sinuses;Reduced anterior  laryngeal mobility  Penetration/Aspiration details (nectar cup) (None)  Pharyngeal - Nectar Straw (None)  Penetration/Aspiration details (nectar straw) (None)  Pharyngeal - Nectar Syringe (None)  Penetration/Aspiration details (nectar syringe) (None)  Pharyngeal - Ice Chips (None)  Penetration/Aspiration details (ice chips) (None)  Pharyngeal - Thin Teaspoon Reduced pharyngeal peristalsis;Reduced  epiglottic inversion;Reduced tongue base retraction;Reduced  airway/laryngeal closure;Reduced laryngeal  elevation;Penetration/Aspiration during swallow;Pharyngeal residue -  valleculae;Pharyngeal  residue - pyriform sinuses;Reduced anterior  laryngeal mobility  Penetration/Aspiration details (thin teaspoon) Material enters airway,  remains ABOVE vocal cords and not ejected out  Pharyngeal - Thin Cup Reduced pharyngeal peristalsis;Reduced epiglottic  inversion;Pharyngeal residue - valleculae;Pharyngeal residue - pyriform  sinuses;Trace aspiration;Penetration/Aspiration during  swallow;Penetration/Aspiration after swallow;Reduced anterior laryngeal  mobility  Penetration/Aspiration details (thin cup) Material enters airway, passes  BELOW cords and not ejected out despite cough attempt by patient  Pharyngeal - Thin Straw (None)  Penetration/Aspiration details (thin straw) (None)  Pharyngeal - Thin Syringe (None)  Penetration/Aspiration details (thin syringe') (None)  Pharyngeal - Puree Reduced pharyngeal peristalsis;Reduced epiglottic  inversion;Pharyngeal residue - valleculae;Reduced tongue base  retraction;Reduced airway/laryngeal closure;Reduced laryngeal  elevation;Reduced anterior laryngeal mobility  Penetration/Aspiration details (puree) (None)  Pharyngeal - Mechanical Soft Reduced pharyngeal peristalsis;Reduced  epiglottic inversion;Pharyngeal residue - valleculae;Reduced laryngeal  elevation;Reduced airway/laryngeal closure;Reduced tongue base  retraction;Reduced anterior laryngeal mobility  Penetration/Aspiration details (mechanical soft) (None)  Pharyngeal - Regular (None)  Penetration/Aspiration details (regular) (None)  Pharyngeal - Multi-consistency (None)  Penetration/Aspiration details (multi-consistency) (None)  Pharyngeal - Pill (None)  Penetration/Aspiration details (pill) (None)  Pharyngeal Comment pharyngeal residuals *(mixing with viscous secretions)  across consistencies noted with pt awareness - liquid swallows faciliated  clearance but resulted in mild aspiration, pt is able to expectorate to  clear vallecular residuals he does not swallow     CHL IP CERVICAL ESOPHAGEAL PHASE 06/13/2014   Cervical Esophageal Phase Impaired                    Nectar Cup Reduced cricopharyngeal relaxation        Thin Teaspoon Reduced cricopharyngeal relaxation                                                                                                          Mills Koller, MS [facility] SLP [phone]      PERTINENT LAB RESULTS: CBC:  Recent Labs  06/17/14 1503 06/17/14 1630  WBC  --  4.8  HGB 13.6 12.1*  HCT 40.0 37.2*  PLT  --  155   CMET CMP     Component Value Date/Time   NA 136 06/17/2014 1503   K 4.3 06/17/2014 1503   CL 99 06/17/2014 1503   CO2 26 06/17/2014 0924   GLUCOSE 100* 06/17/2014 1503   BUN 29* 06/17/2014 1503   CREATININE 1.40* 06/17/2014 1503   CALCIUM 8.9 06/17/2014 0924   PROT 6.8 06/17/2014  1610   ALBUMIN 2.1* 06/17/2014 0924   AST 86* 06/17/2014 0924   ALT 61* 06/17/2014 0924   ALKPHOS 39 06/17/2014 0924   BILITOT 0.9 06/17/2014 0924   GFRNONAA 49* 06/17/2014 0924   GFRAA 56* 06/17/2014 0924    GFR Estimated Creatinine Clearance: 34.7 mL/min (by C-G formula based on Cr of 1.4). No results for input(s): LIPASE, AMYLASE in the last 72 hours. No results for input(s): CKTOTAL, CKMB, CKMBINDEX, TROPONINI in the last 72 hours. Invalid input(s): POCBNP No results for input(s): DDIMER in the last 72 hours. No results for input(s): HGBA1C in the last 72 hours. No results for input(s): CHOL, HDL, LDLCALC, TRIG, CHOLHDL, LDLDIRECT in the last 72 hours. No results for input(s): TSH, T4TOTAL, T3FREE, THYROIDAB in the last 72 hours.  Invalid input(s): FREET3  Recent Labs  06/17/14 1630  RETICCTPCT 0.8   Coags: No results for input(s): INR in the last 72 hours.  Invalid input(s): PT Microbiology: No results found for this or any previous visit (from the past 240 hour(s)).   BRIEF HOSPITAL COURSE:  Suspected Myasthenia gravis: Patient presented with worsening weakness along with intermittent diplopia, ptosis and dysphagia to  outpatient neurology, subsequently referred to the hospitalist service. Seen by neuro hospitalist team, and started on IV Ig and Mestinon. MRI brain negative, acetylcholine receptor antibodies and striated muscle antibodies negative. Unfortunately no significant response to IVIG, subsequently started on plasma exchange on 2/23-plans are for 5 treatments every other day. Okay to transfer to CIR if plasmapheresis could be continued while at CIR. spoke with neurology, they will follow patient while at CIR. Myasthenia gravis panel is still pending, please follow   Dysphagia: Seen by speech therapy, current recommendations are dysphagia 2 diet. Cautiously continue.   Dehydration: Resolved with IV fluids.   Hypertension: Continue benazepril.   Severe protein calorie malnutrition: Continue supplements   Constipation: Continue MiraLAX   BPH: Continue Proscar   TODAY-DAY OF DISCHARGE:  Subjective:   Tracy Kinner today has no headache,no chest abdominal pain,no new weakness tingling or numbness, feels much better wants to go home today.   Objective:   Blood pressure 176/93, pulse 64, temperature 98.5 F (36.9 C), temperature source Oral, resp. rate 19, height 5\' 9"  (1.753 m), weight 61.4 kg (135 lb 5.8 oz), SpO2 100 %.  Intake/Output Summary (Last 24 hours) at 06/18/14 1031 Last data filed at 06/18/14 9604  Gross per 24 hour  Intake   1070 ml  Output    750 ml  Net    320 ml   Filed Weights   06/12/14 1223 06/17/14 1450  Weight: 59 kg (130 lb 1.1 oz) 61.4 kg (135 lb 5.8 oz)    Exam Awake Alert, Oriented *3, No new F.N deficits, Normal affect [place].AT,PERRAL Supple Neck,No JVD, No cervical lymphadenopathy appriciated.  Symmetrical Chest wall movement, Good air movement bilaterally, CTAB RRR,No Gallops,Rubs or new Murmurs, No Parasternal Heave +ve B.Sounds, Abd Soft, Non tender, No organomegaly appriciated, No rebound -guarding or rigidity. No Cyanosis, Clubbing or edema, No new  Rash or bruise  DISCHARGE CONDITION: Stable  DISPOSITION: CIR  DISCHARGE INSTRUCTIONS:    Activity:  As tolerated   Diet recommendation: Heart Healthy diet  Discharge Instructions    Diet - low sodium heart healthy    Complete by:  As directed      Increase activity slowly    Complete by:  As directed            Follow-up Information  Follow up with Marylynn Pearson, MD. Schedule an appointment as soon as possible for a visit in 1 week.   Specialty:  Family Medicine   Contact information:   [address] [place] [address] [phone]       Follow up with Levert Feinstein, MD. Schedule an appointment as soon as possible for a visit in 2 weeks.   Specialty:  Neurology   Why:  after discharge from [facility]   Contact information:   [address] [place] [address] [phone]       Total Time spent on discharge equals 45 minutes.  SignedJeoffrey Massed 06/18/2014 10:31 AM

## 2014-06-18 NOTE — Progress Notes (Signed)
Pt OOB and in the hallway walking, he said he forget where he is. RN re-oriented pt, pt able to recall that he is in the hospital. Reminded pt not to walk by himself and to use the call light when assistance needed, call light placed within reached. Pt verbalized understanding. We will continue to monitor.

## 2014-06-18 NOTE — PMR Pre-admission (Signed)
PMR Admission Coordinator Pre-Admission Assessment  Patient: [name] is an 81 y.o., male MRN: [id number] DOB: 1928-07-22 Height: 5\' 9"  (175.3 cm) Weight: 61.4 kg (135 lb 5.8 oz)              Insurance Information HMO:     PPO:       PCP:       IPA:       80/20:       OTHER:   PRIMARY: Medicare A/B      Policy#: [id number]      Subscriber: Kennith Maes CM Name:        Phone#:       Fax#:   Pre-Cert#:        Employer: Retired Benefits:  Phone #:       Name: Checked in [place]. Date:  A=07/25/95 and B=07/24/97     Deduct: $1288      Out of Pocket Max: none      Life Max: unlimited CIR: 100%      SNF: 100 days Outpatient: 80%     Co-Pay: 20% Home Health: 100%      Co-Pay: none DME: 80%     Co-Pay: 20% Providers: patient's choice  SECONDARY: Generic [employer]      Policy#: [id number]      Subscriber: Kennith Maes CM Name:        Phone#:       Fax#:   Pre-Cert#:        Employer: Retired Benefits:  Phone #: [phone]     Name:   Eff. Date:       Deduct:        Out of Pocket Max:        Life Max:   CIR:        SNF:   Outpatient:       Co-Pay:   Home Health:        Co-Pay:   DME:       Co-Pay:    Emergency Contact Information Contact Information    Name Relation Home Work Mobile   [place] Daughter [phone]       Current Medical History  Patient Admitting Diagnosis:  Myesthenia Gravis  History of Present Illness: A 81 y.o. right handed male with history of hypertension. Patient had been living alone and independent until the spring of 2015 with noted gradual onset of generalized weakness, double vision, 20 pound weight loss and dysphagia followed by outpatient neurology services with workup concerning for myasthenia gravis. Presented 06/12/2014 with increasing weakness. MRI of the brain showed no acute intracranial abnormality. Placed on IVIG  5 doses with very little significant improvement and ongoing workup per neurology services with myasthenia gravis panel  pending. Plasmapheresis initiated 06/17/2013 every other day  5 treatments. Maintain on a dysphagia 2 thin liquid diet. Subcutaneous heparin for DVT prophylaxis. Physical and occupational therapy evaluations completed with recommendations of physical medicine rehabilitation consult. Patient to be admitted for comprehensive inpatient rehabilitation program.    Past Medical History  Past Medical History  Diagnosis Date  . Hypertension   . Carotid artery disease   . Weight loss, unintentional   . Protein-calorie malnutrition, moderate     Family History  family history includes Heart attack in his brother; Heart disease in his mother and sister; Parkinson's disease in his brother; Stroke in his brother.  Prior Rehab/Hospitalizations:  Had 1 outpatient PT session in [place].   Current Medications   Current facility-administered medications:  .  acetaminophen (TYLENOL) tablet 650 mg, 650 mg, Oral, Q6H PRN **OR** acetaminophen (TYLENOL) suppository 650 mg, 650 mg, Rectal, Q6H PRN, Jeralyn BennettEzequiel Zamora, MD .  alum & mag hydroxide-simeth (MAALOX/MYLANTA) 200-200-20 MG/5ML suspension 30 mL, 30 mL, Oral, Q6H PRN, Jeralyn BennettEzequiel Zamora, MD .  benazepril (LOTENSIN) tablet 20 mg, 20 mg, Oral, Daily, Jeralyn BennettEzequiel Zamora, MD, 20 mg at 06/18/14 1052 .  feeding supplement (ENSURE COMPLETE) (ENSURE COMPLETE) liquid 237 mL, 237 mL, Oral, BID BM, Jeralyn BennettEzequiel Zamora, MD, 237 mL at 06/18/14 1056 .  finasteride (PROSCAR) tablet 5 mg, 5 mg, Oral, Daily, Jeralyn BennettEzequiel Zamora, MD, 5 mg at 06/18/14 1052 .  heparin injection 3,000 Units, 3,000 Units, Intracatheter, Once, Duayne CalPaul W Hoffman, NP, 3,000 Units at 06/16/14 1530 .  heparin injection 5,000 Units, 5,000 Units, Subcutaneous, 3 times per day, Jeralyn BennettEzequiel Zamora, MD, 5,000 Units at 06/18/14 0527 .  lactose free nutrition (BOOST PLUS) liquid 237 mL, 237 mL, Oral, BID WC, Jenifer A Williams, RD, 237 mL at 06/17/14 1700 .  ondansetron (ZOFRAN) tablet 4 mg, 4 mg, Oral, Q6H PRN **OR**  ondansetron (ZOFRAN) injection 4 mg, 4 mg, Intravenous, Q6H PRN, Jeralyn BennettEzequiel Zamora, MD, 4 mg at 06/15/14 0550 .  oxyCODONE (Oxy IR/ROXICODONE) immediate release tablet 5 mg, 5 mg, Oral, Q4H PRN, Jeralyn BennettEzequiel Zamora, MD, 5 mg at 06/17/14 1914 .  polyethylene glycol (MIRALAX / GLYCOLAX) packet 17 g, 17 g, Oral, BID, Maretta BeesShanker M Ghimire, MD, 17 g at 06/18/14 1053 .  pyridostigmine (MESTINON) tablet 60 mg, 60 mg, Oral, 3 times per day, Ulice Dashavid R Smith, PA-C, 60 mg at 06/18/14 [id number] .  sodium chloride 0.9 % bolus 1,000 mL, 1,000 mL, Intravenous, Once, Jeralyn BennettEzequiel Zamora, MD .  sodium chloride 0.9 % injection 3 mL, 3 mL, Intravenous, Q12H, Jeralyn BennettEzequiel Zamora, MD, 3 mL at 06/18/14 1056 .  sodium phosphate (FLEET) 7-19 GM/118ML enema 1 enema, 1 enema, Rectal, Daily PRN, Maretta BeesShanker M Ghimire, MD  Patients Current Diet: DIET DYS 2 Diet - low sodium heart healthy  Precautions / Restrictions Precautions Precautions: Fall Precaution Comments: pt/family report multiple falls at home Restrictions Weight Bearing Restrictions: No   Prior Activity Level Community (5-7x/wk): 3-4 X a week, still driving.  Home Assistive Devices / Equipment Home Assistive Devices/Equipment: None Home Equipment: None  Prior Functional Level Prior Function Level of Independence: Independent ("cruised" along furniture)  Current Functional Level Cognition  Overall Cognitive Status: Within Functional Limits for tasks assessed    Extremity Assessment (includes Sensation/Coordination)  Upper Extremity Assessment: Generalized weakness (generally 3/5 in Bil UEs)  Lower Extremity Assessment: Defer to PT evaluation RLE Deficits / Details: hip flexors 3/5, quads 3+/5, hams 3/5, df/pr 4/5 RLE Coordination: decreased fine motor LLE Deficits / Details: hip flexors 3/5, quads 3+/5, hams 3/5, df/pf 4/5, LLE Coordination: decreased fine motor    ADLs  Overall ADL's : Needs assistance/impaired Eating/Feeding: Supervision/ safety, Sitting (90*, with  full supervision per SLP) Grooming: Sitting, Standing, Oral care, Wash/dry face, Wash/dry hands, Applying deodorant, Min guard (shaved as well) Grooming Details (indicate cue type and reason): pt needed VC's to locate items needed. Min A to stand.  Upper Body Bathing: Min guard, Standing Lower Body Bathing: Min guard, Sit to/from stand Lower Body Bathing Details (indicate cue type and reason): did not wash feet or lower legs Upper Body Dressing : Set up, Supervision/safety, Sitting, Standing Upper Body Dressing Details (indicate cue type and reason): OT assisted with doffing gown while standing so pt could bathe UB. Lower Body Dressing: Maximal assistance, Sitting/lateral leans (socks)  Toilet Transfer: Min guard, Ambulation, [facility] Toilet Transfer Details (indicate cue type and reason): VC's for hand placement. Min A to power up due to LE weakness. Pt demonstrated unsafe technique qith  Toileting- Clothing Manipulation and Hygiene: Minimal assistance, Sitting/lateral lean Toileting - Clothing Manipulation Details (indicate cue type and reason): Pt performed toilet hygiene in sitting and required min A to stand Functional mobility during ADLs: Rolling walker, Min guard General ADL Comments: Pt ambulated to sink and performed ADLs. Educated on deep breathing technique. Encouraged pt to cough when at sink.    Mobility  Overal bed mobility: Needs Assistance Bed Mobility: Supine to Sit Rolling: Min assist Sidelying to sit: Min assist Supine to sit: Min guard Sit to supine: Min assist General bed mobility comments: not assessed    Transfers  Overall transfer level: Needs assistance Equipment used: Rolling walker (2 wheeled) Transfers: Sit to/from Stand Sit to Stand: Min guard Stand pivot transfers: Min guard General transfer comment: cues for technique. Assist and cues to scoot to edge of chair.    Ambulation / Gait / Stairs / Wheelchair Mobility  Ambulation/Gait Ambulation/Gait assistance:  Hydrographic surveyor (Feet): 180 Feet Assistive device: Rolling walker (2 wheeled) Gait Pattern/deviations: Step-through pattern, Trunk flexed Gait velocity: decreased Gait velocity interpretation: Below normal speed for age/gender General Gait Details: frequent cueing for posture and to stay within BOS of RW.    Posture / Balance Balance Overall balance assessment: Needs assistance Sitting-balance support: No upper extremity supported Sitting balance-Leahy Scale: Fair Standing balance support: Bilateral upper extremity supported, During functional activity Standing balance-Leahy Scale: Poor Standing balance comment: Pt requires UE support for dynamic balance. Pt with LOB when reaching for items at sink.  Standardized Balance Assessment Standardized Balance Assessment : Berg Balance Test Berg Balance Test Sit to Stand: Able to stand using hands after several tries Standing Unsupported: Able to stand 2 minutes with supervision Sitting with Back Unsupported but Feet Supported on Floor or Stool: Able to sit safely and securely 2 minutes Stand to Sit: Uses backs of legs against chair to control descent Transfers: Able to transfer with verbal cueing and /or supervision Standing Unsupported with Eyes Closed: Able to stand 10 seconds with supervision Standing Ubsupported with Feet Together: Needs help to attain position but able to stand for 30 seconds with feet together From Standing, Reach Forward with Outstretched Arm: Can reach forward >5 cm safely (2") From Standing Position, Pick up Object from Floor: Able to pick up shoe, needs supervision From Standing Position, Turn to Look Behind Over each Shoulder: Turn sideways only but maintains balance Turn 360 Degrees: Needs close supervision or verbal cueing Standing Unsupported, Alternately Place Feet on Step/Stool: Needs assistance to keep from falling or unable to try Standing Unsupported, One Foot in Front: Needs help to step but  can hold 15 seconds Standing on One Leg: Tries to lift leg/unable to hold 3 seconds but remains standing independently Total Score: 27    Special needs/care consideration BiPAP/CPAP No CPM No Continuous Drip IV No Plasma Exchange        Days QOD x 5 exchanges Life Vest No Oxygen No Special Bed No Trach Size No Wound Vac (area) No       Skin: Had some precancer lesions on scalp removed                             Bowel mgmt: Last BM 06/13/14 Bladder mgmt: Voiding in  urinal Diabetic mgmt No    Previous Home Environment Living Arrangements: Alone Available Help at Discharge: Family, Available 24 hours/day (live across the street) Type of Home: House Home Layout: One level, Other (Comment) (1 room is raised/sunk by 1 step) Home Access: Stairs to enter Bathroom Shower/Tub: Engineer, manufacturing systems: Standard Home Care Services: No  Discharge Living Setting Plans for Discharge Living Setting: Patient's home, Alone, House (Lives alone with family close by.) Type of Home at Discharge: House Discharge Home Layout: One level Discharge Home Access: Stairs to enter Entrance Stairs-Number of Steps: 2 stpes front and 5 steps at the usual back entry. Does the patient have any problems obtaining your medications?: No  Social/Family/Support Systems Patient Roles: Parent (Is a widower.  Has a dtr, son and dtr-in-law.) Contact Information: Lesia Sago - Dtr [phone] Anticipated Caregiver: Family are all close by within 2 minutes walking distance Ability/Limitations of Caregiver: Family to assist patient after discharge as needed Caregiver Availability: 24/7 (Will have help as needed.) Discharge Plan Discussed with Primary Caregiver: Yes Is Caregiver In Agreement with Plan?: Yes Does Caregiver/Family have Issues with Lodging/Transportation while Pt is in Rehab?: No  Goals/Additional Needs Patient/Family Goal for Rehab: PT/OT/ST mod I to supervision goals Expected length of  stay: 7-11 days Cultural Considerations: Attends [employer] Dietary Needs: Dys 2, thin liquids Equipment Needs: TBD Pt/Family Agrees to Admission and willing to participate: Yes Program Orientation Provided & Reviewed with Pt/Caregiver Including Roles  & Responsibilities: Yes  Decrease burden of Care through IP rehab admission: N/A  Possible need for SNF placement upon discharge: Not planned  Patient Condition: This patient's medical and functional status has changed since the consult dated: 06/16/14 in which the Rehabilitation Physician determined and documented that the patient's condition is appropriate for intensive rehabilitative care in an inpatient rehabilitation facility. See "History of Present Illness" (above) for medical update. Functional changes are:  Currently requiring min guard 180 ft RW. Patient's medical and functional status update has been discussed with the Rehabilitation physician and patient remains appropriate for inpatient rehabilitation. Will admit to inpatient rehab today.  Preadmission Screen Completed By:  Trish Mage, 06/18/2014 11:14 AM ______________________________________________________________________   Discussed status with Dr.  Riley Kill on 06/18/14 at  1114 and received telephone approval for admission today.  Admission Coordinator:  Trish Mage, time1114/Date02/24/16

## 2014-06-18 NOTE — H&P (View-Only) (Signed)
  Physical Medicine and Rehabilitation Admission H&P    Chief complaint: Weakness   HPI: [name] is a 81 y.o. right handed male with history of hypertension. Patient had been living alone and independent until the spring of 2015 with noted gradual onset of generalized weakness, double vision, 20 pound weight loss and dysphagia followed by outpatient neurology services with workup concerning for myasthenia gravis. Presented 06/12/2014 with increasing weakness. MRI of the brain showed no acute intracranial abnormality. Placed on IVIG 5 doses with very little significant improvement and ongoing workup per neurology services with myasthenia gravis panel pending. Plasmapheresis initiated 06/17/2013 every other day 5 treatments. Maintain on a dysphagia 2 thin liquid diet. Subcutaneous heparin for DVT prophylaxis. Physical and occupational therapy evaluations completed with recommendations of physical medicine rehabilitation consult. Patient was admitted for comprehensive rehabilitation program   ROS Review of Systems  Constitutional: Positive for weight loss.  Eyes: Positive for double vision.  Respiratory: Positive for cough.  Gastrointestinal:   GERD  Genitourinary: Positive for urgency.  Neurological: Positive for weakness.   Dysphagia  Psychiatric/Behavioral: Positive for depression.  All other systems reviewed and are negative   Past Medical History  Diagnosis Date  . Hypertension   . Carotid artery disease   . Weight loss, unintentional   . Protein-calorie malnutrition, moderate    Past Surgical History  Procedure Laterality Date  . Hernia repair      Bilateral  . Prostate biopsy      WNL  . Hemorrhoid surgery    . Cataract extraction     Family History  Problem Relation Age of Onset  . Parkinson's disease Brother     Deceased - 74  . Heart disease Sister     Deceased - 82  . Heart attack Brother     Deceased - 78  . Stroke Brother   . Heart  disease Mother    Social History:  reports that he quit smoking about 44 years ago. He does not have any smokeless tobacco history on file. He reports that he does not drink alcohol or use illicit drugs. Allergies:  Allergies  Allergen Reactions  . Sulfa Antibiotics Itching   Medications Prior to Admission  Medication Sig Dispense Refill  . aspirin 325 MG tablet Take 325 mg by mouth daily.    . benazepril (LOTENSIN) 20 MG tablet Take 20 mg by mouth daily.    . finasteride (PROSCAR) 5 MG tablet Take 5 mg by mouth daily.    . mirtazapine (REMERON) 15 MG tablet Take 15 mg by mouth at bedtime.    . omeprazole (PRILOSEC) 20 MG capsule Take 20 mg by mouth 2 (two) times daily.      Home: Home Living Family/patient expects to be discharged to:: Private residence Living Arrangements: Alone Available Help at Discharge: Family, Available 24 hours/day (live across the street) Type of Home: House Home Access: Stairs to enter Home Layout: One level, Other (Comment) (1 room is raised/sunk by 1 step) Home Equipment: None   Functional History: Prior Function Level of Independence: Independent ("cruised" along furniture)  Functional Status:  Mobility: Bed Mobility Overal bed mobility: Needs Assistance Bed Mobility: Supine to Sit Rolling: Min assist Sidelying to sit: Min assist Supine to sit: Min guard Sit to supine: Min assist General bed mobility comments: not assessed Transfers Overall transfer level: Needs assistance Equipment used: Rolling walker (2 wheeled) Transfers: Sit to/from Stand Sit to Stand: Min guard Stand pivot transfers: Min guard General transfer   comment: cues for technique. Assist and cues to scoot to edge of chair. Ambulation/Gait Ambulation/Gait assistance: Min guard Ambulation Distance (Feet): 180 Feet Assistive device: Rolling walker (2 wheeled) Gait Pattern/deviations: Step-through pattern, Trunk flexed Gait velocity: decreased Gait velocity interpretation:  Below normal speed for age/gender General Gait Details: frequent cueing for posture and to stay within BOS of RW.    ADL: ADL Overall ADL's : Needs assistance/impaired Eating/Feeding: Supervision/ safety, Sitting (90*, with full supervision per SLP) Grooming: Sitting, Standing, Oral care, Wash/dry face, Wash/dry hands, Applying deodorant, Min guard (shaved as well) Grooming Details (indicate cue type and reason): pt needed VC's to locate items needed. Min A to stand.  Upper Body Bathing: Min guard, Standing Lower Body Bathing: Min guard, Sit to/from stand Lower Body Bathing Details (indicate cue type and reason): did not wash feet or lower legs Upper Body Dressing : Set up, Supervision/safety, Sitting, Standing Upper Body Dressing Details (indicate cue type and reason): OT assisted with doffing gown while standing so pt could bathe UB. Lower Body Dressing: Maximal assistance, Sitting/lateral leans (socks) Toilet Transfer: Min guard, Ambulation, BSC Toilet Transfer Details (indicate cue type and reason): VC's for hand placement. Min A to power up due to LE weakness. Pt demonstrated unsafe technique qith  Toileting- Clothing Manipulation and Hygiene: Minimal assistance, Sitting/lateral lean Toileting - Clothing Manipulation Details (indicate cue type and reason): Pt performed toilet hygiene in sitting and required min A to stand Functional mobility during ADLs: Rolling walker, Min guard General ADL Comments: Pt ambulated to sink and performed ADLs. Educated on deep breathing technique. Encouraged pt to cough when at sink.  Cognition: Cognition Overall Cognitive Status: Within Functional Limits for tasks assessed Cognition Arousal/Alertness: Awake/alert Behavior During Therapy: WFL for tasks assessed/performed, Flat affect Overall Cognitive Status: Within Functional Limits for tasks assessed  Physical Exam: Blood pressure 176/93, pulse 64, temperature 98.5 F (36.9 C), temperature  source Oral, resp. rate 19, height 5' 9" (1.753 m), weight 61.4 kg (135 lb 5.8 oz), SpO2 100 %. Physical Exam Constitutional: He is oriented to person, place, and time.  81 year old right-handed male. Frail appearing HENT: dentition fair. Oral mucosa is pink Head: Normocephalic.  Eyes: EOM are normal.  Neck: Normal range of motion. Neck supple. No thyromegaly present.  Cardiovascular: Normal rate and regular rhythm.no murmur or rubs.   Respiratory:  Good inspiratory effort some upper airway rhonchi. Otherwise clear GI: Soft. Bowel sounds are normal. He exhibits no distension.  Neurological: He is alert and oriented to person, place, and time.   decreased fine motor skills. Mild ptosis and facial diplegia persists. Speech dysarthric. No sensory deficits. UE's 4/5 delt,tricep,bicep, wrist, HI.. LE's. 4/5 HF, KE and ADF/APF.   Seems to have fair to good insight and awareness.DTR's 1+ to absent .  Skin: Skin is warm and dry.  Psychiatric: He has a normal mood and affect. He is a little restless today.   Results for orders placed or performed during the hospital encounter of 06/12/14 (from the past 48 hour(s))  Comprehensive metabolic panel     Status: Abnormal   Collection Time: 06/17/14  9:24 AM  Result Value Ref Range   Sodium 132 (L) 135 - 145 mmol/L   Potassium 4.4 3.5 - 5.1 mmol/L   Chloride 103 96 - 112 mmol/L   CO2 26 19 - 32 mmol/L   Glucose, Bld 123 (H) 70 - 99 mg/dL   BUN 27 (H) 6 - 23 mg/dL   Creatinine, Ser 1.31 0.50 - 1.35  mg/dL   Calcium 8.9 8.4 - 10.5 mg/dL   Total Protein 6.8 6.0 - 8.3 g/dL   Albumin 2.1 (L) 3.5 - 5.2 g/dL   AST 86 (H) 0 - 37 U/L   ALT 61 (H) 0 - 53 U/L   Alkaline Phosphatase 39 39 - 117 U/L   Total Bilirubin 0.9 0.3 - 1.2 mg/dL   GFR calc non Af Amer 49 (L) >90 mL/min   GFR calc Af Amer 56 (L) >90 mL/min    Comment: (NOTE) The eGFR has been calculated using the CKD EPI equation. This calculation has not been validated in all clinical  situations. eGFR's persistently <90 mL/min signify possible Chronic Kidney Disease.    Anion gap 3 (L) 5 - 15  I-STAT, chem 8     Status: Abnormal   Collection Time: 06/17/14  3:03 PM  Result Value Ref Range   Sodium 136 135 - 145 mmol/L   Potassium 4.3 3.5 - 5.1 mmol/L   Chloride 99 96 - 112 mmol/L   BUN 29 (H) 6 - 23 mg/dL   Creatinine, Ser 1.40 (H) 0.50 - 1.35 mg/dL   Glucose, Bld 100 (H) 70 - 99 mg/dL   Calcium, Ion 1.30 1.13 - 1.30 mmol/L   TCO2 21 0 - 100 mmol/L   Hemoglobin 13.6 13.0 - 17.0 g/dL   HCT 40.0 39.0 - 52.0 %  CBC     Status: Abnormal   Collection Time: 06/17/14  4:30 PM  Result Value Ref Range   WBC 4.8 4.0 - 10.5 K/uL   RBC 4.06 (L) 4.22 - 5.81 MIL/uL   Hemoglobin 12.1 (L) 13.0 - 17.0 g/dL   HCT 37.2 (L) 39.0 - 52.0 %   MCV 91.6 78.0 - 100.0 fL   MCH 29.8 26.0 - 34.0 pg   MCHC 32.5 30.0 - 36.0 g/dL   RDW 16.3 (H) 11.5 - 15.5 %   Platelets 155 150 - 400 K/uL  Reticulocytes     Status: Abnormal   Collection Time: 06/17/14  4:30 PM  Result Value Ref Range   Retic Ct Pct 0.8 0.4 - 3.1 %   RBC. 4.06 (L) 4.22 - 5.81 MIL/uL   Retic Count, Manual 32.5 19.0 - 186.0 K/uL   Dg Chest Port 1 View  06/16/2014   CLINICAL DATA:  81-year-old male status post central line placement, positioning. Initial encounter.  EXAM: PORTABLE CHEST - 1 VIEW  COMPARISON:  06/12/2014 and earlier.  FINDINGS: Portable AP upright view at 1535 hours. Left IJ approach central line now in place. Tip projects at the cavoatrial junction level. Lower lung volumes. Stable cardiac size and mediastinal contours. No pneumothorax. Increased interstitial opacity, in part felt due to crowding. Increased retrocardiac opacity, Suggestion of small left pleural effusion.  IMPRESSION: 1. Left IJ approach central line placed, tip at the cavoatrial junction level. 2. Lower lung volumes with increased crowding of markings and possible vascular congestion. 3. Increased lung base opacity suspected due to left pleural  effusion.   Electronically Signed   By: H  Hall M.D.   On: 06/16/2014 15:54       Medical Problem List and Plan: 1. Myasthenia gravis. IVIG treatments 5 of completed with plasmapheresis initiated 06/17/2014 every other day 5 treatments. Continue Mestinon 60 mg 3 times a day.   -schedule plasmapheresis later in the day to avoid conflicts with therapies 2.  DVT Prophylaxis/Anticoagulation: Subcutaneous heparin. Monitor platelet counts in the signs of bleeding 3. Pain Management: Oxycodone as needed.   Monitor with increased mobility 4. Dysphagia. Dysphagia 2 thin liquids.  Encourage PO, monitor i's and o's. Check labs, etc 5. Neuropsych: This patient is capable of making decisions on his own behalf. 6. Skin/Wound Care: Routine skin checks. Intact currently 7. Fluids/Electrolytes/Nutrition: Strict I & O with follow-up chemistries 8. Hypertension. Lotensin 20 mg daily. Monitor for any orthostasis 9. BPH. Proscar 5 mg daily. Check PVRs 3, OOB to void    Post Admission Physician Evaluation: 1. Functional deficits secondary  to myasthenia gravis. 2. Patient is admitted to receive collaborative, interdisciplinary care between the physiatrist, rehab nursing staff, and therapy team. 3. Patient's level of medical complexity and substantial therapy needs in context of that medical necessity cannot be provided at a lesser intensity of care such as a SNF. 4. Patient has experienced substantial functional loss from his/her baseline which was documented above under the "Functional History" and "Functional Status" headings.  Judging by the patient's diagnosis, physical exam, and functional history, the patient has potential for functional progress which will result in measurable gains while on inpatient rehab.  These gains will be of substantial and practical use upon discharge  in facilitating mobility and self-care at the household level. 5. Physiatrist will provide 24 hour management of medical needs as  well as oversight of the therapy plan/treatment and provide guidance as appropriate regarding the interaction of the two. 6. 24 hour rehab nursing will assist with bladder management, bowel management, safety, skin/wound care, disease management, medication administration, pain management and patient education  and help integrate therapy concepts, techniques,education, etc. 7. PT will assess and treat for/with: Lower extremity strength, range of motion, stamina, balance, functional mobility, safety, adaptive techniques and equipment, NMR, activity tolerance, family and patient education.   Goals are: mod I. 8. OT will assess and treat for/with: ADL's, functional mobility, safety, upper extremity strength, adaptive techniques and equipment, NMR, stamina, balance, family education, ego support, community reintegration.   Goals are: mod I. Therapy may proceed with showering this patient. 9. SLP will assess and treat for/with: speech, communication, swallowing.  Goals are: mod I to supervision. 10. Case Management and Social Worker will assess and treat for psychological issues and discharge planning. 11. Team conference will be held weekly to assess progress toward goals and to determine barriers to discharge. 12. Patient will receive at least 3 hours of therapy per day at least 5 days per week. 13. ELOS: 7-9 days       14. Prognosis:  excellent     Meredith Staggers, MD, [facility] 06/18/2014   06/18/2014

## 2014-06-18 NOTE — H&P (Signed)
Physical Medicine and Rehabilitation Admission H&P    Chief complaint: Weakness   HPI: [name] is a 81 y.o. right handed male with history of hypertension. Patient had been living alone and independent until the spring of 2015 with noted gradual onset of generalized weakness, double vision, 20 pound weight loss and dysphagia followed by outpatient neurology services with workup concerning for myasthenia gravis. Presented 06/12/2014 with increasing weakness. MRI of the brain showed no acute intracranial abnormality. Placed on IVIG 5 doses with very little significant improvement and ongoing workup per neurology services with myasthenia gravis panel pending. Plasmapheresis initiated 06/17/2013 every other day 5 treatments. Maintain on a dysphagia 2 thin liquid diet. Subcutaneous heparin for DVT prophylaxis. Physical and occupational therapy evaluations completed with recommendations of physical medicine rehabilitation consult. Patient was admitted for comprehensive rehabilitation program   ROS Review of Systems  Constitutional: Positive for weight loss.  Eyes: Positive for double vision.  Respiratory: Positive for cough.  Gastrointestinal:   GERD  Genitourinary: Positive for urgency.  Neurological: Positive for weakness.   Dysphagia  Psychiatric/Behavioral: Positive for depression.  All other systems reviewed and are negative   Past Medical History  Diagnosis Date  . Hypertension   . Carotid artery disease   . Weight loss, unintentional   . Protein-calorie malnutrition, moderate    Past Surgical History  Procedure Laterality Date  . Hernia repair      Bilateral  . Prostate biopsy      WNL  . Hemorrhoid surgery    . Cataract extraction     Family History  Problem Relation Age of Onset  . Parkinson's disease Brother     Deceased - 28  . Heart disease Sister     Deceased - 65  . Heart attack Brother     Deceased - 50  . Stroke Brother   . Heart  disease Mother    Social History:  reports that he quit smoking about 44 years ago. He does not have any smokeless tobacco history on file. He reports that he does not drink alcohol or use illicit drugs. Allergies:  Allergies  Allergen Reactions  . Sulfa Antibiotics Itching   Medications Prior to Admission  Medication Sig Dispense Refill  . aspirin 325 MG tablet Take 325 mg by mouth daily.    . benazepril (LOTENSIN) 20 MG tablet Take 20 mg by mouth daily.    . finasteride (PROSCAR) 5 MG tablet Take 5 mg by mouth daily.    . mirtazapine (REMERON) 15 MG tablet Take 15 mg by mouth at bedtime.    Marland Kitchen omeprazole (PRILOSEC) 20 MG capsule Take 20 mg by mouth 2 (two) times daily.      Home: Home Living Family/patient expects to be discharged to:: Private residence Living Arrangements: Alone Available Help at Discharge: Family, Available 24 hours/day (live across the street) Type of Home: House Home Access: Stairs to enter [place]: One level, Other (Comment) (1 room is raised/sunk by 1 step) Home Equipment: None   Functional History: Prior Function Level of Independence: Independent ("cruised" along furniture)  Functional Status:  Mobility: Bed Mobility Overal bed mobility: Needs Assistance Bed Mobility: Supine to Sit Rolling: Min assist Sidelying to sit: Min assist Supine to sit: Min guard Sit to supine: Min assist General bed mobility comments: not assessed Transfers Overall transfer level: Needs assistance Equipment used: Rolling walker (2 wheeled) Transfers: Sit to/from Stand Sit to Stand: Min guard Stand pivot transfers: Min guard General transfer  comment: cues for technique. Assist and cues to scoot to edge of chair. Ambulation/Gait Ambulation/Gait assistance: Min guard Ambulation Distance (Feet): 180 Feet Assistive device: Rolling walker (2 wheeled) Gait Pattern/deviations: Step-through pattern, Trunk flexed Gait velocity: decreased Gait velocity interpretation:  Below normal speed for age/gender General Gait Details: frequent cueing for posture and to stay within BOS of RW.    ADL: ADL Overall ADL's : Needs assistance/impaired Eating/Feeding: Supervision/ safety, Sitting (90*, with full supervision per SLP) Grooming: Sitting, Standing, Oral care, Wash/dry face, Wash/dry hands, Applying deodorant, Min guard (shaved as well) Grooming Details (indicate cue type and reason): pt needed VC's to locate items needed. Min A to stand.  Upper Body Bathing: Min guard, Standing Lower Body Bathing: Min guard, Sit to/from stand Lower Body Bathing Details (indicate cue type and reason): did not wash feet or lower legs Upper Body Dressing : Set up, Supervision/safety, Sitting, Standing Upper Body Dressing Details (indicate cue type and reason): OT assisted with doffing gown while standing so pt could bathe UB. Lower Body Dressing: Maximal assistance, Sitting/lateral leans (socks) Toilet Transfer: Min guard, Ambulation, BSC Toilet Transfer Details (indicate cue type and reason): VC's for hand placement. Min A to power up due to LE weakness. Pt demonstrated unsafe technique qith  Toileting- Clothing Manipulation and Hygiene: Minimal assistance, Sitting/lateral lean Toileting - Clothing Manipulation Details (indicate cue type and reason): Pt performed toilet hygiene in sitting and required min A to stand Functional mobility during ADLs: Rolling walker, Min guard General ADL Comments: Pt ambulated to sink and performed ADLs. Educated on deep breathing technique. Encouraged pt to cough when at sink.  Cognition: Cognition Overall Cognitive Status: Within Functional Limits for tasks assessed Cognition Arousal/Alertness: Awake/alert Behavior During Therapy: WFL for tasks assessed/performed, Flat affect Overall Cognitive Status: Within Functional Limits for tasks assessed  Physical Exam: Blood pressure 176/93, pulse 64, temperature 98.5 F (36.9 C), temperature  source Oral, resp. rate 19, height 5' 9" (1.753 m), weight 61.4 kg (135 lb 5.8 oz), SpO2 100 %. Physical Exam Constitutional: He is oriented to person, place, and time.  81 year old right-handed male. Frail appearing HENT: dentition fair. Oral mucosa is pink Head: Normocephalic.  Eyes: EOM are normal.  Neck: Normal range of motion. Neck supple. No thyromegaly present.  Cardiovascular: Normal rate and regular rhythm.no murmur or rubs.   Respiratory:  Good inspiratory effort some upper airway rhonchi. Otherwise clear GI: Soft. Bowel sounds are normal. He exhibits no distension.  Neurological: He is alert and oriented to person, place, and time.   decreased fine motor skills. Mild ptosis and facial diplegia persists. Speech dysarthric. No sensory deficits. UE's 4/5 delt,tricep,bicep, wrist, HI.. LE's. 4/5 HF, KE and ADF/APF.   Seems to have fair to good insight and awareness.DTR's 1+ to absent .  Skin: Skin is warm and dry.  Psychiatric: He has a normal mood and affect. He is a little restless today.   Results for orders placed or performed during the hospital encounter of 06/12/14 (from the past 48 hour(s))  Comprehensive metabolic panel     Status: Abnormal   Collection Time: 06/17/14  9:24 AM  Result Value Ref Range   Sodium 132 (L) 135 - 145 mmol/L   Potassium 4.4 3.5 - 5.1 mmol/L   Chloride 103 96 - 112 mmol/L   CO2 26 19 - 32 mmol/L   Glucose, Bld 123 (H) 70 - 99 mg/dL   BUN 27 (H) 6 - 23 mg/dL   Creatinine, Ser 1.31 0.50 - 1.35  mg/dL   Calcium 8.9 8.4 - 10.5 mg/dL   Total Protein 6.8 6.0 - 8.3 g/dL   Albumin 2.1 (L) 3.5 - 5.2 g/dL   AST 86 (H) 0 - 37 U/L   ALT 61 (H) 0 - 53 U/L   Alkaline Phosphatase 39 39 - 117 U/L   Total Bilirubin 0.9 0.3 - 1.2 mg/dL   GFR calc non Af Amer 49 (L) >90 mL/min   GFR calc Af Amer 56 (L) >90 mL/min    Comment: (NOTE) The eGFR has been calculated using the CKD EPI equation. This calculation has not been validated in all clinical  situations. eGFR's persistently <90 mL/min signify possible Chronic Kidney Disease.    Anion gap 3 (L) 5 - 15  I-STAT, chem 8     Status: Abnormal   Collection Time: 06/17/14  3:03 PM  Result Value Ref Range   Sodium 136 135 - 145 mmol/L   Potassium 4.3 3.5 - 5.1 mmol/L   Chloride 99 96 - 112 mmol/L   BUN 29 (H) 6 - 23 mg/dL   Creatinine, Ser 1.40 (H) 0.50 - 1.35 mg/dL   Glucose, Bld 100 (H) 70 - 99 mg/dL   Calcium, Ion 1.30 1.13 - 1.30 mmol/L   TCO2 21 0 - 100 mmol/L   Hemoglobin 13.6 13.0 - 17.0 g/dL   HCT 40.0 39.0 - 52.0 %  CBC     Status: Abnormal   Collection Time: 06/17/14  4:30 PM  Result Value Ref Range   WBC 4.8 4.0 - 10.5 K/uL   RBC 4.06 (L) 4.22 - 5.81 MIL/uL   Hemoglobin 12.1 (L) 13.0 - 17.0 g/dL   HCT 37.2 (L) 39.0 - 52.0 %   MCV 91.6 78.0 - 100.0 fL   MCH 29.8 26.0 - 34.0 pg   MCHC 32.5 30.0 - 36.0 g/dL   RDW 16.3 (H) 11.5 - 15.5 %   Platelets 155 150 - 400 K/uL  Reticulocytes     Status: Abnormal   Collection Time: 06/17/14  4:30 PM  Result Value Ref Range   Retic Ct Pct 0.8 0.4 - 3.1 %   RBC. 4.06 (L) 4.22 - 5.81 MIL/uL   Retic Count, Manual 32.5 19.0 - 186.0 K/uL   Dg Chest Port 1 View  06/16/2014   CLINICAL DATA:  81 year old male status post central line placement, positioning. Initial encounter.  EXAM: PORTABLE CHEST - 1 VIEW  COMPARISON:  06/12/2014 and earlier.  FINDINGS: Portable AP upright view at 1535 hours. Left IJ approach central line now in place. Tip projects at the cavoatrial junction level. Lower lung volumes. Stable cardiac size and mediastinal contours. No pneumothorax. Increased interstitial opacity, in part felt due to crowding. Increased retrocardiac opacity, Suggestion of small left pleural effusion.  IMPRESSION: 1. Left IJ approach central line placed, tip at the cavoatrial junction level. 2. Lower lung volumes with increased crowding of markings and possible vascular congestion. 3. Increased lung base opacity suspected due to left pleural  effusion.   Electronically Signed   By: Genevie Ann M.D.   On: 06/16/2014 15:54       Medical Problem List and Plan: 1. Myasthenia gravis. IVIG treatments 5 of completed with plasmapheresis initiated 06/17/2014 every other day 5 treatments. Continue Mestinon 60 mg 3 times a day.   -schedule plasmapheresis later in the day to avoid conflicts with therapies 2.  DVT Prophylaxis/Anticoagulation: Subcutaneous heparin. Monitor platelet counts in the signs of bleeding 3. Pain Management: Oxycodone as needed.  Monitor with increased mobility 4. Dysphagia. Dysphagia 2 thin liquids.  Encourage PO, monitor i's and o's. Check labs, etc 5. Neuropsych: This patient is capable of making decisions on his own behalf. 6. Skin/Wound Care: Routine skin checks. Intact currently 7. Fluids/Electrolytes/Nutrition: Strict I & O with follow-up chemistries 8. Hypertension. Lotensin 20 mg daily. Monitor for any orthostasis 9. BPH. Proscar 5 mg daily. Check PVRs 3, OOB to void    Post Admission Physician Evaluation: 1. Functional deficits secondary  to myasthenia gravis. 2. Patient is admitted to receive collaborative, interdisciplinary care between the physiatrist, rehab nursing staff, and therapy team. 3. Patient's level of medical complexity and substantial therapy needs in context of that medical necessity cannot be provided at a lesser intensity of care such as a SNF. 4. Patient has experienced substantial functional loss from his/her baseline which was documented above under the "Functional History" and "Functional Status" headings.  Judging by the patient's diagnosis, physical exam, and functional history, the patient has potential for functional progress which will result in measurable gains while on inpatient rehab.  These gains will be of substantial and practical use upon discharge  in facilitating mobility and self-care at the household level. 5. Physiatrist will provide 24 hour management of medical needs as  well as oversight of the therapy plan/treatment and provide guidance as appropriate regarding the interaction of the two. 6. 24 hour rehab nursing will assist with bladder management, bowel management, safety, skin/wound care, disease management, medication administration, pain management and patient education  and help integrate therapy concepts, techniques,education, etc. 7. PT will assess and treat for/with: Lower extremity strength, range of motion, stamina, balance, functional mobility, safety, adaptive techniques and equipment, NMR, activity tolerance, family and patient education.   Goals are: mod I. 8. OT will assess and treat for/with: ADL's, functional mobility, safety, upper extremity strength, adaptive techniques and equipment, NMR, stamina, balance, family education, ego support, community reintegration.   Goals are: mod I. Therapy may proceed with showering this patient. 9. SLP will assess and treat for/with: speech, communication, swallowing.  Goals are: mod I to supervision. 10. Case Management and Social Worker will assess and treat for psychological issues and discharge planning. 11. Team conference will be held weekly to assess progress toward goals and to determine barriers to discharge. 12. Patient will receive at least 3 hours of therapy per day at least 5 days per week. 13. ELOS: 7-9 days       14. Prognosis:  excellent     Meredith Staggers, MD, [facility] 06/18/2014   06/18/2014

## 2014-06-18 NOTE — Progress Notes (Signed)
Rehab admissions - I spoke with Dr. Riley KillSwartz this am and I have his approval for inpatient rehab admission today.  Bed available and will admit to rehab today.  We can continue plasma exchange on inpatient rehab.  I have called daughter and made her aware of rehab admission for today.  Call me for questions.  #[phone]

## 2014-06-19 ENCOUNTER — Telehealth: Payer: Self-pay | Admitting: *Deleted

## 2014-06-19 ENCOUNTER — Inpatient Hospital Stay (HOSPITAL_COMMUNITY): Payer: Medicare Other

## 2014-06-19 ENCOUNTER — Inpatient Hospital Stay (HOSPITAL_COMMUNITY): Payer: 59 | Admitting: Physical Therapy

## 2014-06-19 ENCOUNTER — Inpatient Hospital Stay (HOSPITAL_COMMUNITY)
Admission: AD | Admit: 2014-06-19 | Discharge: 2014-06-24 | DRG: 208 | Disposition: E | Payer: Medicare Other | Source: Skilled Nursing Facility | Attending: Emergency Medicine | Admitting: Emergency Medicine

## 2014-06-19 ENCOUNTER — Inpatient Hospital Stay (HOSPITAL_COMMUNITY): Payer: 59

## 2014-06-19 ENCOUNTER — Inpatient Hospital Stay (HOSPITAL_COMMUNITY): Payer: 59 | Admitting: Speech Pathology

## 2014-06-19 DIAGNOSIS — I3139 Other pericardial effusion (noninflammatory): Secondary | ICD-10-CM | POA: Clinically undetermined

## 2014-06-19 DIAGNOSIS — Z6820 Body mass index (BMI) 20.0-20.9, adult: Secondary | ICD-10-CM | POA: Diagnosis not present

## 2014-06-19 DIAGNOSIS — I10 Essential (primary) hypertension: Secondary | ICD-10-CM | POA: Diagnosis present

## 2014-06-19 DIAGNOSIS — J9601 Acute respiratory failure with hypoxia: Principal | ICD-10-CM | POA: Diagnosis present

## 2014-06-19 DIAGNOSIS — J90 Pleural effusion, not elsewhere classified: Secondary | ICD-10-CM | POA: Diagnosis present

## 2014-06-19 DIAGNOSIS — G7000 Myasthenia gravis without (acute) exacerbation: Secondary | ICD-10-CM | POA: Diagnosis present

## 2014-06-19 DIAGNOSIS — Z978 Presence of other specified devices: Secondary | ICD-10-CM

## 2014-06-19 DIAGNOSIS — E440 Moderate protein-calorie malnutrition: Secondary | ICD-10-CM | POA: Diagnosis present

## 2014-06-19 DIAGNOSIS — J690 Pneumonitis due to inhalation of food and vomit: Secondary | ICD-10-CM | POA: Diagnosis present

## 2014-06-19 DIAGNOSIS — R0600 Dyspnea, unspecified: Secondary | ICD-10-CM | POA: Diagnosis present

## 2014-06-19 DIAGNOSIS — Z87891 Personal history of nicotine dependence: Secondary | ICD-10-CM

## 2014-06-19 DIAGNOSIS — J9811 Atelectasis: Secondary | ICD-10-CM | POA: Diagnosis present

## 2014-06-19 DIAGNOSIS — D649 Anemia, unspecified: Secondary | ICD-10-CM | POA: Diagnosis present

## 2014-06-19 DIAGNOSIS — Z66 Do not resuscitate: Secondary | ICD-10-CM | POA: Diagnosis not present

## 2014-06-19 DIAGNOSIS — G7001 Myasthenia gravis with (acute) exacerbation: Secondary | ICD-10-CM

## 2014-06-19 DIAGNOSIS — Z882 Allergy status to sulfonamides status: Secondary | ICD-10-CM

## 2014-06-19 DIAGNOSIS — I4891 Unspecified atrial fibrillation: Secondary | ICD-10-CM | POA: Diagnosis present

## 2014-06-19 DIAGNOSIS — I469 Cardiac arrest, cause unspecified: Secondary | ICD-10-CM | POA: Diagnosis not present

## 2014-06-19 DIAGNOSIS — Z7982 Long term (current) use of aspirin: Secondary | ICD-10-CM

## 2014-06-19 DIAGNOSIS — J9600 Acute respiratory failure, unspecified whether with hypoxia or hypercapnia: Secondary | ICD-10-CM

## 2014-06-19 DIAGNOSIS — D696 Thrombocytopenia, unspecified: Secondary | ICD-10-CM | POA: Diagnosis present

## 2014-06-19 DIAGNOSIS — R1310 Dysphagia, unspecified: Secondary | ICD-10-CM | POA: Diagnosis present

## 2014-06-19 DIAGNOSIS — E876 Hypokalemia: Secondary | ICD-10-CM | POA: Diagnosis not present

## 2014-06-19 DIAGNOSIS — R64 Cachexia: Secondary | ICD-10-CM | POA: Diagnosis present

## 2014-06-19 DIAGNOSIS — I313 Pericardial effusion (noninflammatory): Secondary | ICD-10-CM | POA: Diagnosis present

## 2014-06-19 DIAGNOSIS — Z79899 Other long term (current) drug therapy: Secondary | ICD-10-CM | POA: Diagnosis not present

## 2014-06-19 DIAGNOSIS — Z4659 Encounter for fitting and adjustment of other gastrointestinal appliance and device: Secondary | ICD-10-CM

## 2014-06-19 DIAGNOSIS — J9690 Respiratory failure, unspecified, unspecified whether with hypoxia or hypercapnia: Secondary | ICD-10-CM | POA: Diagnosis present

## 2014-06-19 LAB — COMPREHENSIVE METABOLIC PANEL
ALBUMIN: 2.6 g/dL — AB (ref 3.5–5.2)
ALT: 56 U/L — ABNORMAL HIGH (ref 0–53)
ANION GAP: 9 (ref 5–15)
AST: 82 U/L — ABNORMAL HIGH (ref 0–37)
Alkaline Phosphatase: 28 U/L — ABNORMAL LOW (ref 39–117)
BILIRUBIN TOTAL: 1 mg/dL (ref 0.3–1.2)
BUN: 29 mg/dL — AB (ref 6–23)
CO2: 22 mmol/L (ref 19–32)
Calcium: 9 mg/dL (ref 8.4–10.5)
Chloride: 107 mmol/L (ref 96–112)
Creatinine, Ser: 1.3 mg/dL (ref 0.50–1.35)
GFR calc Af Amer: 57 mL/min — ABNORMAL LOW (ref >90)
GFR calc non Af Amer: 49 mL/min — ABNORMAL LOW (ref >90)
GLUCOSE: 112 mg/dL — AB (ref 70–99)
POTASSIUM: 3.9 mmol/L (ref 3.5–5.1)
Sodium: 138 mmol/L (ref 135–145)
TOTAL PROTEIN: 5.8 g/dL — AB (ref 6.0–8.3)

## 2014-06-19 LAB — CBC WITH DIFFERENTIAL/PLATELET
BASOS ABS: 0 10*3/uL (ref 0.0–0.1)
BASOS PCT: 0 % (ref 0–1)
EOS ABS: 0.1 10*3/uL (ref 0.0–0.7)
Eosinophils Relative: 1 % (ref 0–5)
HCT: 35.2 % — ABNORMAL LOW (ref 39.0–52.0)
Hemoglobin: 11.4 g/dL — ABNORMAL LOW (ref 13.0–17.0)
LYMPHS PCT: 17 % (ref 12–46)
Lymphs Abs: 1.2 10*3/uL (ref 0.7–4.0)
MCH: 30.2 pg (ref 26.0–34.0)
MCHC: 32.4 g/dL (ref 30.0–36.0)
MCV: 93.4 fL (ref 78.0–100.0)
Monocytes Absolute: 1.2 10*3/uL — ABNORMAL HIGH (ref 0.1–1.0)
Monocytes Relative: 16 % — ABNORMAL HIGH (ref 3–12)
Neutro Abs: 4.8 10*3/uL (ref 1.7–7.7)
Neutrophils Relative %: 66 % (ref 43–77)
PLATELETS: 128 10*3/uL — AB (ref 150–400)
RBC: 3.77 MIL/uL — AB (ref 4.22–5.81)
RDW: 16.8 % — ABNORMAL HIGH (ref 11.5–15.5)
WBC: 7.2 10*3/uL (ref 4.0–10.5)

## 2014-06-19 LAB — POCT I-STAT, CHEM 8
BUN: 29 mg/dL — ABNORMAL HIGH (ref 6–23)
CREATININE: 1.3 mg/dL (ref 0.50–1.35)
Calcium, Ion: 1.31 mmol/L — ABNORMAL HIGH (ref 1.13–1.30)
Chloride: 106 mmol/L (ref 96–112)
Glucose, Bld: 116 mg/dL — ABNORMAL HIGH (ref 70–99)
HCT: 37 % — ABNORMAL LOW (ref 39.0–52.0)
Hemoglobin: 12.6 g/dL — ABNORMAL LOW (ref 13.0–17.0)
POTASSIUM: 3.8 mmol/L (ref 3.5–5.1)
SODIUM: 142 mmol/L (ref 135–145)
TCO2: 18 mmol/L (ref 0–100)

## 2014-06-19 LAB — CBC
HEMATOCRIT: 38.5 % — AB (ref 39.0–52.0)
Hemoglobin: 12.2 g/dL — ABNORMAL LOW (ref 13.0–17.0)
MCH: 29.4 pg (ref 26.0–34.0)
MCHC: 31.7 g/dL (ref 30.0–36.0)
MCV: 92.8 fL (ref 78.0–100.0)
Platelets: 129 10*3/uL — ABNORMAL LOW (ref 150–400)
RBC: 4.15 MIL/uL — AB (ref 4.22–5.81)
RDW: 16.8 % — ABNORMAL HIGH (ref 11.5–15.5)
WBC: 7.4 10*3/uL (ref 4.0–10.5)

## 2014-06-19 LAB — GLUCOSE, CAPILLARY
GLUCOSE-CAPILLARY: 97 mg/dL (ref 70–99)
Glucose-Capillary: 119 mg/dL — ABNORMAL HIGH (ref 70–99)
Glucose-Capillary: 122 mg/dL — ABNORMAL HIGH (ref 70–99)

## 2014-06-19 LAB — CREATININE, SERUM
Creatinine, Ser: 1.31 mg/dL (ref 0.50–1.35)
GFR calc Af Amer: 56 mL/min — ABNORMAL LOW (ref >90)
GFR calc non Af Amer: 49 mL/min — ABNORMAL LOW (ref >90)

## 2014-06-19 MED ORDER — SODIUM CHLORIDE 0.9 % IV SOLN: 250.0000 mL | INTRAVENOUS | Status: DC | PRN

## 2014-06-19 MED ORDER — FAMOTIDINE 40 MG/5ML PO SUSR
20.0000 mg | Freq: Every day | ORAL | Status: DC
  Administered 2014-06-20 – 2014-06-21 (×2): 20 mg
  Filled 2014-06-19 (×4): qty 2.5

## 2014-06-19 MED ORDER — AMIODARONE LOAD VIA INFUSION
150.0000 mg | Freq: Once | INTRAVENOUS | Status: AC
  Administered 2014-06-19: 150 mg via INTRAVENOUS
  Filled 2014-06-19: qty 83.34

## 2014-06-19 MED ORDER — HEPARIN SODIUM (PORCINE) 5000 UNIT/ML IJ SOLN
5000.0000 [IU] | Freq: Three times a day (TID) | INTRAMUSCULAR | Status: DC
  Administered 2014-06-19 – 2014-06-21 (×8): 5000 [IU] via SUBCUTANEOUS
  Filled 2014-06-19 (×11): qty 1

## 2014-06-19 MED ORDER — CHLORHEXIDINE GLUCONATE 0.12 % MT SOLN
15.0000 mL | Freq: Two times a day (BID) | OROMUCOSAL | Status: DC
  Administered 2014-06-19 – 2014-06-21 (×5): 15 mL via OROMUCOSAL
  Filled 2014-06-19 (×3): qty 15

## 2014-06-19 MED ORDER — AMIODARONE HCL IN DEXTROSE 360-4.14 MG/200ML-% IV SOLN
60.0000 mg/h | INTRAVENOUS | Status: AC
  Administered 2014-06-19 (×2): 60 mg/h via INTRAVENOUS
  Filled 2014-06-19 (×2): qty 200

## 2014-06-19 MED ORDER — ACD FORMULA A 0.73-2.45-2.2 GM/100ML VI SOLN
Status: AC
  Filled 2014-06-19: qty 1000

## 2014-06-19 MED ORDER — PYRIDOSTIGMINE BROMIDE 60 MG PO TABS
90.0000 mg | ORAL_TABLET | Freq: Three times a day (TID) | ORAL | Status: DC
  Administered 2014-06-19 – 2014-06-21 (×9): 90 mg
  Filled 2014-06-19 (×12): qty 1.5

## 2014-06-19 MED ORDER — LEVALBUTEROL HCL 0.63 MG/3ML IN NEBU: 0.6300 mg | INHALATION_SOLUTION | RESPIRATORY_TRACT | Status: DC | PRN

## 2014-06-19 MED ORDER — CALCIUM CARBONATE ANTACID 500 MG PO CHEW
CHEWABLE_TABLET | ORAL | Status: AC
  Filled 2014-06-19: qty 2

## 2014-06-19 MED ORDER — AMIODARONE HCL IN DEXTROSE 360-4.14 MG/200ML-% IV SOLN
30.0000 mg/h | INTRAVENOUS | Status: DC
  Administered 2014-06-19 – 2014-06-21 (×4): 30 mg/h via INTRAVENOUS
  Filled 2014-06-19 (×10): qty 200

## 2014-06-19 MED ORDER — VITAL HIGH PROTEIN PO LIQD
1000.0000 mL | ORAL | Status: DC
  Filled 2014-06-19 (×2): qty 1000

## 2014-06-19 MED ORDER — SODIUM CHLORIDE 0.9 % IV SOLN
INTRAVENOUS | Status: DC
  Filled 2014-06-19 (×3): qty 200

## 2014-06-19 MED ORDER — DILTIAZEM HCL 100 MG IV SOLR
5.0000 mg/h | INTRAVENOUS | Status: DC
  Administered 2014-06-19: 5 mg/h via INTRAVENOUS
  Administered 2014-06-20: 10 mg/h via INTRAVENOUS
  Filled 2014-06-19 (×2): qty 100

## 2014-06-19 MED ORDER — VITAL AF 1.2 CAL PO LIQD
1000.0000 mL | ORAL | Status: DC
  Administered 2014-06-19 – 2014-06-21 (×3): 1000 mL
  Filled 2014-06-19 (×7): qty 1000

## 2014-06-19 MED ORDER — CETYLPYRIDINIUM CHLORIDE 0.05 % MT LIQD
7.0000 mL | Freq: Two times a day (BID) | OROMUCOSAL | Status: DC
  Administered 2014-06-20 – 2014-06-21 (×4): 7 mL via OROMUCOSAL

## 2014-06-19 MED ORDER — POLYETHYLENE GLYCOL 3350 17 G PO PACK
17.0000 g | PACK | Freq: Every day | ORAL | Status: DC
  Administered 2014-06-19 – 2014-06-20 (×2): 17 g via ORAL
  Filled 2014-06-19 (×4): qty 1

## 2014-06-19 MED ORDER — SODIUM CHLORIDE 0.9 % IV SOLN
4.0000 g | Freq: Once | INTRAVENOUS | Status: DC
  Filled 2014-06-19: qty 40

## 2014-06-19 MED ORDER — DILTIAZEM HCL 100 MG IV SOLR
5.0000 mg/h | INTRAVENOUS | Status: DC
  Filled 2014-06-19: qty 100

## 2014-06-19 MED ORDER — DILTIAZEM HCL 25 MG/5ML IV SOLN
5.0000 mg | Freq: Once | INTRAVENOUS | Status: AC
  Administered 2014-06-19: 5 mg via INTRAVENOUS
  Filled 2014-06-19: qty 5

## 2014-06-19 NOTE — Progress Notes (Signed)
81 y.o. right handed male with history of hypertension. Patient had been living alone and independent until the spring of 2015 with noted gradual onset of generalized weakness, double vision, 20 pound weight loss and dysphagia followed by outpatient neurology services with workup concerning for myasthenia gravis. Presented 06/12/2014 with increasing weakness. MRI of the brain showed no acute intracranial abnormality. Placed on IVIG 5 doses with very little significant improvement and ongoing workup per neurology services with myasthenia gravis panel pending. Plasmapheresis initiated 06/17/2013 every other day 5 treatments. Maintain on a dysphagia 2 thin liquid diet  Subjective/Complaints:   Objective: Vital Signs: Blood pressure 183/75, pulse 74, temperature 98.2 F (36.8 C), temperature source Oral, resp. rate 18, height 5' 9"  (1.753 m), weight 60.51 kg (133 lb 6.4 oz), SpO2 98 %. No results found. Results for orders placed or performed during the hospital encounter of 06/18/14 (from the past 72 hour(s))  CBC     Status: Abnormal   Collection Time: 06/18/14  6:00 PM  Result Value Ref Range   WBC 8.1 4.0 - 10.5 K/uL   RBC 4.00 (L) 4.22 - 5.81 MIL/uL   Hemoglobin 12.1 (L) 13.0 - 17.0 g/dL   HCT 37.2 (L) 39.0 - 52.0 %   MCV 93.0 78.0 - 100.0 fL   MCH 30.3 26.0 - 34.0 pg   MCHC 32.5 30.0 - 36.0 g/dL   RDW 16.5 (H) 11.5 - 15.5 %   Platelets 133 (L) 150 - 400 K/uL  Creatinine, serum     Status: Abnormal   Collection Time: 06/18/14  6:00 PM  Result Value Ref Range   Creatinine, Ser 1.30 0.50 - 1.35 mg/dL   GFR calc non Af Amer 49 (L) >90 mL/min   GFR calc Af Amer 57 (L) >90 mL/min    Comment: (NOTE) The eGFR has been calculated using the CKD EPI equation. This calculation has not been validated in all clinical situations. eGFR's persistently <90 mL/min signify possible Chronic Kidney Disease.   CBC WITH DIFFERENTIAL     Status: Abnormal   Collection Time: 06/11/2014  5:37 AM  Result  Value Ref Range   WBC 7.2 4.0 - 10.5 K/uL   RBC 3.77 (L) 4.22 - 5.81 MIL/uL   Hemoglobin 11.4 (L) 13.0 - 17.0 g/dL   HCT 35.2 (L) 39.0 - 52.0 %   MCV 93.4 78.0 - 100.0 fL   MCH 30.2 26.0 - 34.0 pg   MCHC 32.4 30.0 - 36.0 g/dL   RDW 16.8 (H) 11.5 - 15.5 %   Platelets 128 (L) 150 - 400 K/uL   Neutrophils Relative % 66 43 - 77 %   Neutro Abs 4.8 1.7 - 7.7 K/uL   Lymphocytes Relative 17 12 - 46 %   Lymphs Abs 1.2 0.7 - 4.0 K/uL   Monocytes Relative 16 (H) 3 - 12 %   Monocytes Absolute 1.2 (H) 0.1 - 1.0 K/uL   Eosinophils Relative 1 0 - 5 %   Eosinophils Absolute 0.1 0.0 - 0.7 K/uL   Basophils Relative 0 0 - 1 %   Basophils Absolute 0.0 0.0 - 0.1 K/uL  Comprehensive metabolic panel     Status: Abnormal   Collection Time: 06/16/2014  5:37 AM  Result Value Ref Range   Sodium 138 135 - 145 mmol/L   Potassium 3.9 3.5 - 5.1 mmol/L   Chloride 107 96 - 112 mmol/L    Comment: DELTA CHECK NOTED   CO2 22 19 - 32 mmol/L   Glucose,  Bld 112 (H) 70 - 99 mg/dL   BUN 29 (H) 6 - 23 mg/dL   Creatinine, Ser 1.30 0.50 - 1.35 mg/dL   Calcium 9.0 8.4 - 10.5 mg/dL   Total Protein 5.8 (L) 6.0 - 8.3 g/dL   Albumin 2.6 (L) 3.5 - 5.2 g/dL   AST 82 (H) 0 - 37 U/L   ALT 56 (H) 0 - 53 U/L   Alkaline Phosphatase 28 (L) 39 - 117 U/L   Total Bilirubin 1.0 0.3 - 1.2 mg/dL   GFR calc non Af Amer 49 (L) >90 mL/min   GFR calc Af Amer 57 (L) >90 mL/min    Comment: (NOTE) The eGFR has been calculated using the CKD EPI equation. This calculation has not been validated in all clinical situations. eGFR's persistently <90 mL/min signify possible Chronic Kidney Disease.    Anion gap 9 5 - 15     HEENT: facial weakness Cardio: RRR Resp: CTA B/L and unlabored GI: BS positive and NT, ND Extremity:  Pulses positive and No Edema Skin:   Intact and Other Left IJ cath CDI Neuro: Alert/Oriented, Flat, Normal Sensory and Abnormal Motor 3- bilateral delt 4/5 biceps and tricep, 5/5 grip, 3+ bialteral HF, 4/5 Knee ext, 4/5  ankle Musc/Skel:  Normal Gen NAD   Assessment/Plan: 1. Functional deficits secondary to MG exacerbation which require 3+ hours per day of interdisciplinary therapy in a comprehensive inpatient rehab setting. Physiatrist is providing close team supervision and 24 hour management of active medical problems listed below. Physiatrist and rehab team continue to assess barriers to discharge/monitor patient progress toward functional and medical goals. FIM: FIM - Bathing Bathing: 0: Activity did not occur  FIM - Upper Body Dressing/Undressing Upper body dressing/undressing: 0: Activity did not occur FIM - Lower Body Dressing/Undressing Lower body dressing/undressing: 0: Activity did not occur  FIM - Toileting Toileting steps completed by patient: Adjust clothing prior to toileting, Performs perineal hygiene (using urinal) Toileting: 0: Activity did not occur  FIM - Air cabin crew Transfers: 0-Activity did not occur     FIM - Locomotion: Wheelchair Locomotion: Wheelchair: 0: Activity did not occur FIM - Locomotion: Ambulation Ambulation/Gait Assistance: 4: Min assist Locomotion: Ambulation: 0: Activity did not occur  Comprehension Comprehension Mode: Not assessed  Expression Expression: 5-Expresses complex 90% of the time/cues < 10% of the time  Social Interaction Social Interaction Mode: Not assessed     Memory Memory Mode: Not assessed Medical Problem List and Plan: 1. Myasthenia gravis. IVIG treatments 5 of completed with plasmapheresis initiated 06/17/2014 every other day 5 treatments. Continue Mestinon 60 mg 3 times a day.               -schedule plasmapheresis later in the day to avoid conflicts with therapies 2.  DVT Prophylaxis/Anticoagulation: Subcutaneous heparin. Monitor platelet counts in the signs of bleeding 3. Pain Management: Oxycodone as needed. Monitor with increased mobility 4. Dysphagia. Dysphagia 2 thin liquids.  Encourage PO, monitor i's and  o's. Check labs, etc 5. Neuropsych: This patient is capable of making decisions on his own behalf. 6. Skin/Wound Care: Routine skin checks. Intact currently 7. Fluids/Electrolytes/Nutrition: Strict I & O with follow-up chemistries 8. Hypertension. Lotensin 20 mg daily. Monitor for any orthostasis 9. BPH. Proscar 5 mg daily. Check PVRs 3, OOB to void  LOS (Days) 1 A FACE TO FACE EVALUATION WAS PERFORMED  KIRSTEINS,ANDREW E 06/09/2014, 7:12 AM

## 2014-06-19 NOTE — Progress Notes (Signed)
Ranelle Oyster, MD Physician Signed Physical Medicine and Rehabilitation Consult Note 06/16/2014 5:53 AM  Related encounter: Admission (Discharged) from 06/12/2014 in [facility] 5W MEDICAL    Expand All Collapse All        Physical Medicine and Rehabilitation Consult Reason for Consult: Myasthenia gravis Referring Physician: [facility]   HPI: [name] is a 81 y.o. right handed male with history of hypertension. Patient had been living alone and independent until the spring of 2015 with noted gradual onset of generalized weakness, double vision, 20 pound weight loss and dysphagia followed by outpatient neurology services with workup concerning for myasthenia gravis. Presented 06/12/2014 with increasing weakness. MRI of the brain showed no acute intracranial abnormality. Placed on IVIG 5 doses with ongoing workup per neurology services as well as possible need for plasmapheresis. Maintain on a dysphagia 2 thin liquid diet. Subcutaneous heparin for DVT prophylaxis. Physical and occupational therapy evaluations completed with recommendations of physical medicine rehabilitation consult   Review of Systems  Constitutional: Positive for weight loss.  Eyes: Positive for double vision.  Respiratory: Positive for cough.  Gastrointestinal:   GERD  Genitourinary: Positive for urgency.  Neurological: Positive for weakness.   Dysphagia  Psychiatric/Behavioral: Positive for depression.  All other systems reviewed and are negative.  Past Medical History  Diagnosis Date  . Hypertension   . Carotid artery disease   . Weight loss, unintentional   . Protein-calorie malnutrition, moderate    Past Surgical History  Procedure Laterality Date  . Hernia repair      Bilateral  . Prostate biopsy      WNL  . Hemorrhoid surgery    . Cataract extraction     Family History  Problem Relation Age of Onset  . Parkinson's  disease Brother     Deceased - 87  . Heart disease Sister     Deceased - 3  . Heart attack Brother     Deceased - 66  . Stroke Brother   . Heart disease Mother    Social History:  reports that he quit smoking about 44 years ago. He does not have any smokeless tobacco history on file. He reports that he does not drink alcohol or use illicit drugs. Allergies:  Allergies  Allergen Reactions  . Sulfa Antibiotics Itching   Medications Prior to Admission  Medication Sig Dispense Refill  . aspirin 325 MG tablet Take 325 mg by mouth daily.    . benazepril (LOTENSIN) 20 MG tablet Take 20 mg by mouth daily.    . finasteride (PROSCAR) 5 MG tablet Take 5 mg by mouth daily.    . mirtazapine (REMERON) 15 MG tablet Take 15 mg by mouth at bedtime.    Marland Kitchen omeprazole (PRILOSEC) 20 MG capsule Take 20 mg by mouth 2 (two) times daily.      Home: Home Living Family/patient expects to be discharged to:: Private residence Living Arrangements: Alone Available Help at Discharge: Family, Available 24 hours/day (live across the street) Type of Home: House Home Access: Stairs to enter Home Layout: One level, Other (Comment) (1 room is raised/sunk by 1 step) Home Equipment: None  Functional History: Prior Function Level of Independence: Independent ("cruised" along furniture) Functional Status:  Mobility: Bed Mobility Overal bed mobility: Needs Assistance Bed Mobility: Supine to Sit, Sit to Supine Rolling: Min assist Sidelying to sit: Min assist Supine to sit: Min assist Sit to supine: Min assist General bed mobility comments: Pt required heavy min assist with hand  held assist to pull to sitting at EOB. Pt able to use bed rail to assist. VC's for sequencing to get pt to scoot to EOB. Pt required assistance to return LEs to bed when returning supine.  Transfers Overall transfer level: Needs assistance Equipment used: Rolling walker (2  wheeled) Transfers: Sit to/from Stand Sit to Stand: Min assist Stand pivot transfers: Min guard General transfer comment: Min A to stand. Pt demonstrates unsafe technique and pulls on RW. Multimodal cues for hand placement and min A to power up to standing.  Ambulation/Gait Ambulation/Gait assistance: Min guard Ambulation Distance (Feet): 100 Feet Assistive device: Rolling walker (2 wheeled) Gait Pattern/deviations: Step-through pattern, Decreased step length - right, Decreased step length - left Gait velocity: slower Gait velocity interpretation: Below normal speed for age/gender General Gait Details: General weakness and fatigue noted with gait. Pt subjectively reporting he just feels "so weak". Some difficulty maneuvering the RW in tight spaces in the room and needed cues to keep RW with him.    ADL: ADL Overall ADL's : Needs assistance/impaired Eating/Feeding: Supervision/ safety, Sitting (90*, with full supervision per SLP) Grooming: Minimal assistance, Standing, Wash/dry hands Grooming Details (indicate cue type and reason): pt needed VC's to locate items needed. Min A to stand.  Upper Body Bathing: Set up, Sitting Lower Body Bathing: Moderate assistance, Sit to/from stand Lower Body Bathing Details (indicate cue type and reason): Assist to reach Bil LEs and min A to stand Upper Body Dressing : Minimal assistance, Sitting Upper Body Dressing Details (indicate cue type and reason): due to truncal weakness Lower Body Dressing: Maximal assistance, Sit to/from stand Toilet Transfer: Minimal assistance, Ambulation, RW, Comfort height toilet, Grab bars Toilet Transfer Details (indicate cue type and reason): VC's for hand placement. Min A to power up due to LE weakness. Pt demonstrated unsafe technique qith  Toileting- Clothing Manipulation and Hygiene: Minimal assistance, Sitting/lateral lean Toileting - Clothing Manipulation Details (indicate cue type and reason): Pt performed  toilet hygiene in sitting and required min A to stand General ADL Comments: Pt presents with weakness and decreased endurance. Pt requires assistance to get OOB and when ambulating, pt required min A to maintain balance due to instances of stumbling. Without VC's pt picked up RW to maneuver around obstacles and feel that pt would be a high fall risk if returning home by himself. Pt required min A to power up from bed/toilet due to LE weakness.   Cognition: Cognition Overall Cognitive Status: Within Functional Limits for tasks assessed Cognition Arousal/Alertness: Awake/alert Behavior During Therapy: WFL for tasks assessed/performed, Flat affect Overall Cognitive Status: Within Functional Limits for tasks assessed  Blood pressure 151/62, pulse 66, temperature 97.8 F (36.6 C), temperature source Oral, resp. rate 18, height  (1.753 m), weight 59 kg (130 lb 1.1 oz), SpO2 93 %. Physical Exam  Constitutional: He is oriented to person, place, and time.  81 year old right-handed male sitting up in chair  HENT:  Head: Normocephalic.  Eyes: EOM are normal.  Neck: Normal range of motion. Neck supple. No thyromegaly present.  Cardiovascular: Normal rate and regular rhythm.  Respiratory:  Good inspiratory effort some upper airway rhonchi  GI: Soft. Bowel sounds are normal. He exhibits no distension.  Neurological: He is alert and oriented to person, place, and time.  Mild decreased fine motor skills. Mild ptosis and facial diplegia. No sensory deficits. UE's 4/5 prox to distal. LE's. 4/5 prox to distal. Speech slightly slurred. Seems to have intact insight and awareness.  Skin:  Skin is warm and dry.  Psychiatric: He has a normal mood and affect. His behavior is normal.     Lab Results Last 24 Hours    No results found for this or any previous visit (from the past 24 hour(s)).    Imaging Results (Last 48 hours)    No results found.    Assessment/Plan: Diagnosis: myasthenia  gravis 1. Does the need for close, 24 hr/day medical supervision in concert with the patient's rehab needs make it unreasonable for this patient to be served in a less intensive setting? Yes 2. Co-Morbidities requiring supervision/potential complications: htn, dysphagia 3. Due to bladder management, bowel management, safety, skin/wound care, disease management and medication administration, does the patient require 24 hr/day rehab nursing? Yes 4. Does the patient require coordinated care of a physician, rehab nurse, PT (1-2 hrs/day, 5 days/week), OT (1-2 hrs/day, 5 days/week) and SLP (1-2 hrs/day, 5 days/week) to address physical and functional deficits in the context of the above medical diagnosis(es)? Yes Addressing deficits in the following areas: balance, endurance, locomotion, strength, transferring, bowel/bladder control, bathing, dressing, feeding, grooming, toileting, speech, swallowing and psychosocial support 5. Can the patient actively participate in an intensive therapy program of at least 3 hrs of therapy per day at least 5 days per week? Yes 6. The potential for patient to make measurable gains while on inpatient rehab is excellent 7. Anticipated functional outcomes upon discharge from inpatient rehab are modified independent and supervision with PT, modified independent and supervision with OT, modified independent and supervision with SLP. 8. Estimated rehab length of stay to reach the above functional goals is: 7-11 days 9. Does the patient have adequate social supports and living environment to accommodate these discharge functional goals? Yes 10. Anticipated D/C setting: Home 11. Anticipated post D/C treatments: HH therapy 12. Overall Rehab/Functional Prognosis: excellent  RECOMMENDATIONS: This patient's condition is appropriate for continued rehabilitative care in the following setting: CIR Patient has agreed to participate in recommended program. Potentially Note that  insurance prior authorization may be required for reimbursement for recommended care.  Comment: Need to speak with children. Rehab Admissions Coordinator to follow up.  Thanks,  Ranelle OysterZachary T. Swartz, MD, [place] DomFAAPMR     06/16/2014       Revision History     Date/Time User Provider Type Action   06/16/2014 9:47 AM Ranelle OysterZachary T Swartz, MD Physician Sign   06/16/2014 6:47 AM Charlton Amoraniel J Angiulli, PA-C Physician Assistant Pend   View Details Report       Routing History

## 2014-06-19 NOTE — H&P (Signed)
PULMONARY / CRITICAL CARE MEDICINE   Name: [name] MRN: [id number] DOB: [birth year]    ADMISSION DATE:  05/30/2014 CONSULTATION DATE:  2/25  REFERRING MD :  Thad Ranger   CHIEF COMPLAINT:  resp distress   INITIAL PRESENTATION: 81 yo male with hx HTN, malnutrition.  Presented 2/18 with several months of progressive weakness, dysphagia, weight loss and neurologic workup concerning for myasthenia gravis. He was tx to CIR 2/24 and plasma pheresis initiated.  On 2/25 developed increased weakness, AFib with RVR and productive cough and concern for inability to protect airway.  He was tx to Neuro ICU and PCCM asked to admit.   STUDIES:  2/18 MRI brain>>> neg acute, mod generalized atrophy.   SIGNIFICANT EVENTS: 2/25>> tx ICU   HISTORY OF PRESENT ILLNESS: 81 yo male with hx HTN, malnutrition.  Has had several months of progressive weakness, dysphagia, weight loss and neurologic workup concerning for myasthenia gravis. He was admitted to CIR and plasma pheresis initiated.  On 2/25 developed increased weakness, AFib with RVR and productive cough and concern for inability to protect airway.  He was tx to Neuro ICU and PCCM asked to admit.   Currently denies pain, SOB.  C/o productive cough, unable to fully expectorate.  C/o ongoing dysphagia.    PAST MEDICAL HISTORY :   has a past medical history of Hypertension; Carotid artery disease; Weight loss, unintentional; and Protein-calorie malnutrition, moderate.  has past surgical history that includes Hernia repair; Prostate biopsy; Hemorrhoid surgery; and Cataract extraction. Prior to Admission medications   Medication Sig Start Date End Date Taking? Authorizing Provider  aspirin 325 MG tablet Take 325 mg by mouth daily.    Historical Provider, MD  benazepril (LOTENSIN) 20 MG tablet Take 20 mg by mouth daily.    Historical Provider, MD  feeding supplement, ENSURE COMPLETE, (ENSURE COMPLETE) LIQD Take 237 mLs by mouth 2 (two) times daily between  meals. 06/18/14   Shanker Levora Dredge, MD  finasteride (PROSCAR) 5 MG tablet Take 5 mg by mouth daily.    Historical Provider, MD  lactose free nutrition (BOOST PLUS) LIQD Take 237 mLs by mouth 2 (two) times daily with a meal. 06/18/14   Maretta Bees, MD  mirtazapine (REMERON) 15 MG tablet Take 15 mg by mouth at bedtime.    Historical Provider, MD  omeprazole (PRILOSEC) 20 MG capsule Take 20 mg by mouth 2 (two) times daily.    Historical Provider, MD  polyethylene glycol (MIRALAX / GLYCOLAX) packet Take 17 g by mouth 2 (two) times daily. 06/18/14   Shanker Levora Dredge, MD  pyridostigmine (MESTINON) 60 MG tablet Take 1 tablet (60 mg total) by mouth every 8 (eight) hours. 06/18/14   Shanker Levora Dredge, MD   Allergies  Allergen Reactions  . Sulfa Antibiotics Itching    FAMILY HISTORY:  indicated that his mother is deceased. He indicated that his father is deceased.  SOCIAL HISTORY:  reports that he quit smoking about 44 years ago. He does not have any smokeless tobacco history on file. He reports that he does not drink alcohol or use illicit drugs.  REVIEW OF SYSTEMS:  As per HPI - All other systems reviewed and were neg.   SUBJECTIVE:  Coughing but ineffectively Weak voice  VITAL SIGNS: Temp:  [97.5 F (36.4 C)-99 F (37.2 C)] 98.2 F (36.8 C) (02/25 4098) Pulse Rate:  [74-90] 74 (02/25 0613) Resp:  [18] 18 (02/25 0613) BP: (157-183)/(75-90) 183/75 mmHg (02/25 0613) SpO2:  [95 %-100 %]  98 % (02/25 0613) Weight:  [133 lb 6.4 oz (60.51 kg)] 133 lb 6.4 oz (60.51 kg) (02/24 1726) HEMODYNAMICS:   VENTILATOR SETTINGS:   INTAKE / OUTPUT: No intake or output data in the 24 hours ending 06/09/2014 1028  PHYSICAL EXAMINATION: General:  Frail, elderly, cachectic male, NAD  Neuro:  Awake, alert, drowsy at times, MAE, sig gen weakness  HEENT:  Mm dry, no JVD  Cardiovascular:  s1s2 irreg Lungs:  resps even, mildly labored intermittently, weak wet sounding cough, coarse L>R  Abdomen:  Soft,  non tender, +bs  Musculoskeletal:  Warm and dry, 1+ BLE edema, venous stasis   LABS:  CBC  Recent Labs Lab 06/17/14 1630 06/18/14 1800 06/05/2014 0537  WBC 4.8 8.1 7.2  HGB 12.1* 12.1* 11.4*  HCT 37.2* 37.2* 35.2*  PLT 155 133* 128*   Coag's  Recent Labs Lab 06/12/14 1331  INR 1.17   BMET  Recent Labs Lab 06/14/14 0550 06/17/14 0924 06/17/14 1503 06/18/14 1800 06/01/2014 0537  NA 137 132* 136  --  138  K 4.1 4.4 4.3  --  3.9  CL 108 103 99  --  107  CO2 26 26  --   --  22  BUN 24* 27* 29*  --  29*  CREATININE 1.30 1.31 1.40* 1.30 1.30  GLUCOSE 122* 123* 100*  --  112*   Electrolytes  Recent Labs Lab 06/14/14 0550 06/17/14 0924 06/10/2014 0537  CALCIUM 9.3 8.9 9.0   Sepsis Markers No results for input(s): LATICACIDVEN, PROCALCITON, O2SATVEN in the last 168 hours. ABG No results for input(s): PHART, PCO2ART, PO2ART in the last 168 hours. Liver Enzymes  Recent Labs Lab 06/12/14 1331 06/17/14 0924 06/14/2014 0537  AST 113* 86* 82*  ALT 75* 61* 56*  ALKPHOS 36* 39 28*  BILITOT 0.7 0.9 1.0  ALBUMIN 2.5* 2.1* 2.6*   Cardiac Enzymes No results for input(s): TROPONINI, PROBNP in the last 168 hours. Glucose No results for input(s): GLUCAP in the last 168 hours.  Imaging No results found.   ASSESSMENT / PLAN:  PULMONARY Acute respiratory failure - r/t poor airway protection and increased secretions +/- aspiration in setting myasthenia gravis ?aspiration PNA  P:   CXR now  Aggressive pulm hygiene  NT suction PRN  NPO  May require ET intubation if we are unable to confirm adequate airway protection See neuro   CARDIOVASCULAR Hx HTN  AFib with RVR - new, suspect stress induced P:  cardizem gtt  Trend troponin  Echo ordered and pending   RENAL No active issue  P:   F/u chem   GASTROINTESTINAL Protein calorie malnutrition  Weight loss  Dysphagia  P:   NPO  Place panda for TF and meds Aspiration precautions  PPI  Dietary consult    HEMATOLOGIC Thrombocytopenia - mild  P:  F/u cbc  Sq heparin   INFECTIOUS ?aspiration  P:   Monitor wbc and fever curve off abx  CXR ordered and pending  ENDOCRINE No active issue  P:   Monitor glucose on chem   NEUROLOGIC Myasthenia Gravis  P:   Neuro following; pt has received mestinon, IVIg with persistent bulbar sx Planned PLEX today #2/5 NPO  Aggressive pulm hygiene as above    FAMILY  - Updates:  No family available 2/25  - Inter-disciplinary family meet or Palliative Care meeting due by:  3/2   Dirk DressKaty Whiteheart, NP 06/18/2014  10:28 AM Pager: [phone] or [phone]) [phone]  Attending Note:  I  have examined patient, reviewed labs, studies and notes. I have discussed the case with Jasper Riling, and I agree with the data and plans as amended above. Pt with MG and notable bulbar sx, secretion difficulty, weak cough. He has had airway management compromise for about 24h, now complicated by new a fib and RVR. On arrival to ICU he is coughing and clearly has pooled secretions, oxygenating better with less tachypnea since his A fib has been better rate controlled. Agree with NPO status and Panda placement. He is at risk for resp failure due to poor airway protection. We will follow him, his CXR. If he does not stabilize then I favor intubation. Will continue PLEX and mestinon as recommended by neuro, diltiazem as recommended by cardiology. Appreciate their assistance. Independent critical care time is 60 minutes.   Levy Pupa, MD, PhD 06/01/2014, 10:51 AM [facility] [phone] or if no answer [phone]

## 2014-06-19 NOTE — Progress Notes (Signed)
Ronald Mage, RN Rehab Admission Coordinator Signed Physical Medicine and Rehabilitation PMR Pre-admission 06/18/2014 11:00 AM  Related encounter: Admission (Discharged) from 06/12/2014 in [facility] 5W MEDICAL    Expand All Collapse All   PMR Admission Coordinator Pre-Admission Assessment  Patient: [name] is an 81 y.o., male MRN: [id number] DOB: [birth year] Height:  (175.3 cm) Weight: 61.4 kg (135 lb 5.8 oz)  Insurance Information HMO: PPO: PCP: IPA: 80/20: OTHER:  PRIMARY: Medicare A/B Policy#: [id number] Subscriber: Kennith Maes CM Name: Phone#: Fax#:  Pre-Cert#: Employer: Retired Benefits: Phone #: Name: Checked in [place]. Date: A=07/25/95 and B=07/24/97 Deduct: $1288 Out of Pocket Max: none  Life Max: unlimited CIR: 100% SNF: 100 days Outpatient: 80% Co-Pay: 20% Home Health: 100% Co-Pay: none DME: 80% Co-Pay: 20% Providers: patient's choice  SECONDARY: Generic [employer] Policy#: [id number] s Subscriber: Kennith Maes CM Name: Phone#: Fax#:  Pre-Cert#: Employer: Retired Benefits: Phone #: [phone] Name:  Eff. Date: Deduct: Out of Pocket Max: Life Max:  CIR: SNF:  Outpatient: Co-Pay:  Home Health: Co-Pay:  DME: Co-Pay:   Emergency Contact Information Contact Information    Name Relation Home Work Mobile   [place] Daughter [phone]       Current Medical History  Patient Admitting Diagnosis: Myesthenia Gravis  History of Present Illness: A 81 y.o. right handed male with history of hypertension. Patient had been living alone and independent until the spring of 2015 with noted  gradual onset of generalized weakness, double vision, 20 pound weight loss and dysphagia followed by outpatient neurology services with workup concerning for myasthenia gravis. Presented 06/12/2014 with increasing weakness. MRI of the brain showed no acute intracranial abnormality. Placed on IVIG  5 doses with very little significant improvement and ongoing workup per neurology services with myasthenia gravis panel pending. Plasmapheresis initiated 06/17/2013 every other day  5 treatments. Maintain on a dysphagia 2 thin liquid diet. Subcutaneous heparin for DVT prophylaxis. Physical and occupational therapy evaluations completed with recommendations of physical medicine rehabilitation consult. Patient to be admitted for comprehensive inpatient rehabilitation program.   Past Medical History  Past Medical History  Diagnosis Date  . Hypertension   . Carotid artery disease   . Weight loss, unintentional   . Protein-calorie malnutrition, moderate     Family History  family history includes Heart attack in his brother; Heart disease in his mother and sister; Parkinson's disease in his brother; Stroke in his brother.  Prior Rehab/Hospitalizations: Had 1 outpatient PT session in [place].  Current Medications   Current facility-administered medications:  . acetaminophen (TYLENOL) tablet 650 mg, 650 mg, Oral, Q6H PRN **OR** acetaminophen (TYLENOL) suppository 650 mg, 650 mg, Rectal, Q6H PRN, Jeralyn Bennett, MD . alum & mag hydroxide-simeth (MAALOX/MYLANTA) 200-200-20 MG/5ML suspension 30 mL, 30 mL, Oral, Q6H PRN, Jeralyn Bennett, MD . benazepril (LOTENSIN) tablet 20 mg, 20 mg, Oral, Daily, Jeralyn Bennett, MD, 20 mg at 06/18/14 1052 . feeding supplement (ENSURE COMPLETE) (ENSURE COMPLETE) liquid 237 mL, 237 mL, Oral, BID BM, Jeralyn Bennett, MD, 237 mL at 06/18/14 1056 . finasteride (PROSCAR) tablet 5 mg, 5 mg, Oral, Daily, Jeralyn Bennett, MD, 5 mg at 06/18/14  1052 . heparin injection 3,000 Units, 3,000 Units, Intracatheter, Once, Duayne Cal, NP, 3,000 Units at 06/16/14 1530 . heparin injection 5,000 Units, 5,000 Units, Subcutaneous, 3 times per day, Jeralyn Bennett, MD, 5,000 Units at 06/18/14 0527 . lactose free nutrition (BOOST PLUS) liquid 237 mL, 237 mL, Oral, BID WC, Jenifer A Williams, RD, 237 mL  at 06/17/14 1700 . ondansetron (ZOFRAN) tablet 4 mg, 4 mg, Oral, Q6H PRN **OR** ondansetron (ZOFRAN) injection 4 mg, 4 mg, Intravenous, Q6H PRN, Jeralyn Bennett, MD, 4 mg at 06/15/14 0550 . oxyCODONE (Oxy IR/ROXICODONE) immediate release tablet 5 mg, 5 mg, Oral, Q4H PRN, Jeralyn Bennett, MD, 5 mg at 06/17/14 1914 . polyethylene glycol (MIRALAX / GLYCOLAX) packet 17 g, 17 g, Oral, BID, Maretta Bees, MD, 17 g at 06/18/14 1053 . pyridostigmine (MESTINON) tablet 60 mg, 60 mg, Oral, 3 times per day, Ulice Dash, PA-C, 60 mg at 06/18/14 1610 . sodium chloride 0.9 % bolus 1,000 mL, 1,000 mL, Intravenous, Once, Jeralyn Bennett, MD . sodium chloride 0.9 % injection 3 mL, 3 mL, Intravenous, Q12H, Jeralyn Bennett, MD, 3 mL at 06/18/14 1056 . sodium phosphate (FLEET) 7-19 GM/118ML enema 1 enema, 1 enema, Rectal, Daily PRN, Maretta Bees, MD  Patients Current Diet: DIET DYS 2 Diet - low sodium heart healthy  Precautions / Restrictions Precautions Precautions: Fall Precaution Comments: pt/family report multiple falls at home Restrictions Weight Bearing Restrictions: No   Prior Activity Level Community (5-7x/wk): 3-4 X a week, still driving.  Home Assistive Devices / Equipment Home Assistive Devices/Equipment: None Home Equipment: None  Prior Functional Level Prior Function Level of Independence: Independent ("cruised" along furniture)  Current Functional Level Cognition  Overall Cognitive Status: Within Functional Limits for tasks assessed   Extremity Assessment (includes Sensation/Coordination)  Upper Extremity  Assessment: Generalized weakness (generally 3/5 in Bil UEs)  Lower Extremity Assessment: Defer to PT evaluation RLE Deficits / Details: hip flexors 3/5, quads 3+/5, hams 3/5, df/pr 4/5 RLE Coordination: decreased fine motor LLE Deficits / Details: hip flexors 3/5, quads 3+/5, hams 3/5, df/pf 4/5, LLE Coordination: decreased fine motor    ADLs  Overall ADL's : Needs assistance/impaired Eating/Feeding: Supervision/ safety, Sitting (90*, with full supervision per SLP) Grooming: Sitting, Standing, Oral care, Wash/dry face, Wash/dry hands, Applying deodorant, Min guard (shaved as well) Grooming Details (indicate cue type and reason): pt needed VC's to locate items needed. Min A to stand.  Upper Body Bathing: Min guard, Standing Lower Body Bathing: Min guard, Sit to/from stand Lower Body Bathing Details (indicate cue type and reason): did not wash feet or lower legs Upper Body Dressing : Set up, Supervision/safety, Sitting, Standing Upper Body Dressing Details (indicate cue type and reason): OT assisted with doffing gown while standing so pt could bathe UB. Lower Body Dressing: Maximal assistance, Sitting/lateral leans (socks) Toilet Transfer: Min guard, Ambulation, BSC Toilet Transfer Details (indicate cue type and reason): VC's for hand placement. Min A to power up due to LE weakness. Pt demonstrated unsafe technique qith  Toileting- Clothing Manipulation and Hygiene: Minimal assistance, Sitting/lateral lean Toileting - Clothing Manipulation Details (indicate cue type and reason): Pt performed toilet hygiene in sitting and required min A to stand Functional mobility during ADLs: Rolling walker, Min guard General ADL Comments: Pt ambulated to sink and performed ADLs. Educated on deep breathing technique. Encouraged pt to cough when at sink.    Mobility  Overal bed mobility: Needs Assistance Bed Mobility: Supine to Sit Rolling: Min assist Sidelying to sit: Min assist Supine to sit:  Min guard Sit to supine: Min assist General bed mobility comments: not assessed    Transfers  Overall transfer level: Needs assistance Equipment used: Rolling walker (2 wheeled) Transfers: Sit to/from Stand Sit to Stand: Min guard Stand pivot transfers: Min guard General transfer comment: cues for technique. Assist and cues to scoot to  edge of chair.    Ambulation / Gait / Stairs / Wheelchair Mobility  Ambulation/Gait Ambulation/Gait assistance: Hydrographic surveyor (Feet): 180 Feet Assistive device: Rolling walker (2 wheeled) Gait Pattern/deviations: Step-through pattern, Trunk flexed Gait velocity: decreased Gait velocity interpretation: Below normal speed for age/gender General Gait Details: frequent cueing for posture and to stay within BOS of RW.    Posture / Balance Balance Overall balance assessment: Needs assistance Sitting-balance support: No upper extremity supported Sitting balance-Leahy Scale: Fair Standing balance support: Bilateral upper extremity supported, During functional activity Standing balance-Leahy Scale: Poor Standing balance comment: Pt requires UE support for dynamic balance. Pt with LOB when reaching for items at sink.  Standardized Balance Assessment Standardized Balance Assessment : Berg Balance Test Berg Balance Test Sit to Stand: Able to stand using hands after several tries Standing Unsupported: Able to stand 2 minutes with supervision Sitting with Back Unsupported but Feet Supported on Floor or Stool: Able to sit safely and securely 2 minutes Stand to Sit: Uses backs of legs against chair to control descent Transfers: Able to transfer with verbal cueing and /or supervision Standing Unsupported with Eyes Closed: Able to stand 10 seconds with supervision Standing Ubsupported with Feet Together: Needs help to attain position but able to stand for 30 seconds with feet together From Standing, Reach Forward with Outstretched Arm:  Can reach forward >5 cm safely (2") From Standing Position, Pick up Object from Floor: Able to pick up shoe, needs supervision From Standing Position, Turn to Look Behind Over each Shoulder: Turn sideways only but maintains balance Turn 360 Degrees: Needs close supervision or verbal cueing Standing Unsupported, Alternately Place Feet on Step/Stool: Needs assistance to keep from falling or unable to try Standing Unsupported, One Foot in Front: Needs help to step but can hold 15 seconds Standing on One Leg: Tries to lift leg/unable to hold 3 seconds but remains standing independently Total Score: 27    Special needs/care consideration BiPAP/CPAP No CPM No Continuous Drip IV No Plasma Exchange Days QOD x 5 exchanges Life Vest No Oxygen No Special Bed No Trach Size No Wound Vac (area) No  Skin: Had some precancer lesions on scalp removed  Bowel mgmt: Last BM 06/13/14 Bladder mgmt: Voiding in urinal Diabetic mgmt No    Previous Home Environment Living Arrangements: Alone Available Help at Discharge: Family, Available 24 hours/day (live across the street) Type of Home: House Home Layout: One level, Other (Comment) (1 room is raised/sunk by 1 step) Home Access: Stairs to enter Bathroom Shower/Tub: Engineer, manufacturing systems: Standard Home Care Services: No  Discharge Living Setting Plans for Discharge Living Setting: Patient's home, Alone, House (Lives alone with family close by.) Type of Home at Discharge: House Discharge Home Layout: One level Discharge Home Access: Stairs to enter Entrance Stairs-Number of Steps: 2 stpes front and 5 steps at the usual back entry. Does the patient have any problems obtaining your medications?: No  Social/Family/Support Systems Patient Roles: Parent (Is a widower. Has a dtr, son and dtr-in-law.) Contact Information: Lesia Sago - Dtr [phone] Anticipated Caregiver: Family are all close by  within 2 minutes walking distance Ability/Limitations of Caregiver: Family to assist patient after discharge as needed Caregiver Availability: 24/7 (Will have help as needed.) Discharge Plan Discussed with Primary Caregiver: Yes Is Caregiver In Agreement with Plan?: Yes Does Caregiver/Family have Issues with Lodging/Transportation while Pt is in Rehab?: No  Goals/Additional Needs Patient/Family Goal for Rehab: PT/OT/ST mod I to supervision goals Expected length of  stay: 7-11 days Cultural Considerations: Attends [employer] Dietary Needs: Dys 2, thin liquids Equipment Needs: TBD Pt/Family Agrees to Admission and willing to participate: Yes Program Orientation Provided & Reviewed with Pt/Caregiver Including Roles & Responsibilities: Yes  Decrease burden of Care through IP rehab admission: N/A  Possible need for SNF placement upon discharge: Not planned  Patient Condition: This patient's medical and functional status has changed since the consult dated: 06/16/14 in which the Rehabilitation Physician determined and documented that the patient's condition is appropriate for intensive rehabilitative care in an inpatient rehabilitation facility. See "History of Present Illness" (above) for medical update. Functional changes are: Currently requiring min guard 180 ft RW. Patient's medical and functional status update has been discussed with the Rehabilitation physician and patient remains appropriate for inpatient rehabilitation. Will admit to inpatient rehab today.  Preadmission Screen Completed By: Ronald MageLogue, Jaecob Lowden M, 06/18/2014 11:14 AM ______________________________________________________________________  Discussed status with Dr. Riley KillSwartz on 06/18/14 at 1114 and received telephone approval for admission today.  Admission Coordinator: Ronald MageLogue, Catia Todorov M, time1114/Date02/24/16          Cosigned by: Ranelle OysterZachary T Swartz, MD at 06/18/2014 11:32 AM  Revision History     Date/Time User  Provider Type Action   06/18/2014 11:32 AM Ranelle OysterZachary T Swartz, MD Physician Cosign   06/18/2014 11:15 AM Ronald MageEugenia M Shaquelle Hernon, RN Rehab Admission Coordinator Sign

## 2014-06-19 NOTE — Telephone Encounter (Signed)
Left message to check on patient - asked for a return call.

## 2014-06-19 NOTE — Progress Notes (Signed)
Speech-Language Pathology Note  Patient Details Name: Ronald AprilBilly W [name] MRN: [id number] DOB: 1928/10/09   Upon entering room for initial SLP evaluation at 0800, patient had consistent weak cough which he reported started after administration of medications. Due to persistent coughing, patient was only able to verbalize ~2 words at a time. Patient with globus sensation and requested thin liquids to clear. SLP recommended patient NPO at this time, RN in agreement. Patient appeared short of breath and vitals were taken. Patient's O2 saturation was 99%, however, his HR was initially 140, RN made aware. Pulse ox remained on while patient continued to demonstrate constant, weak coughing and his HR began to fluctuate between 140-68, RN informed physician and an EKG was ordered. Patient was eventually transferred off unit, therefore, a skilled SLP evaluation was not completed.    Meesha Sek 05/28/2014, 11:58 AM

## 2014-06-19 NOTE — Significant Event (Addendum)
Rapid Response Event Note  Overview:  Called by staff to assist with patient with afib RVR new onset Time Called: 0906 Arrival Time: 0915 Event Type: Respiratory  Initial Focused Assessment:  On initial call Dr. Thad Rangereynolds was with me - she knows patient and said she would go to bedside and call me if needed.  Second call from 4MW staff - RRT RN to bedside.  On arrival patient alert - warm and diaphoretic  - pink color - sitting up in bed on NRB mask = O2 sats difficult to obtain secondary to RVR - poor waveform on monitor - patient is interactive - can speak - has constant wet cough - upper airway with wet sound - lungs sound clear but resps rapid and shallow - difficult to hear.  Patient in moderate distress.  Able to suction himself - but not really clearing.  Moderate amount frothy white sputum - patient has fairly good cough - difficult to bring up.  In afib RVR - new onset.  Admitted with MG flair - getting plasmaphoresis.  Left jugular HD cath with pigtail port - NS infusing thru port.  MAE x4 - speech clear - voice moderately strong.  BP 177/94 HR 130 -160 RR 22.  RT present - VC 840 - NIF -18 to -30.  Colonel Baldan Angulli PA present and Dr. Thad Rangereynolds present in room with Felicie Mornavid Smith PA neurology.  Dr. Delton SeeNelson - cardiology to room. 12 lead EKG done PTA.  Stat PCXR ordered PTA.     Interventions:  Repositoned patient to maximize upright position.  5mg  Diltiazem IV given per order Dr. Delton SeeNelson.  Assisting patient with pulmonary toilet.  BP post Dilt - 177/94 HR 134. Continue with pulmonary toilet.  Concern for airway protection..  Dr. Thad Rangereynolds states concern with aspiration over past 48 hours.  Again bil lung sounds difficult to hear - upper airway very congested.  2 more doses 5 mg Diltiazem IV given per Dr. Delton SeeNelson order - BP tolerating - after 3rd dose (total 15 mg IV) bp 143/93  HR 124 RR 22 O2 sats 95% on NRB mask - continue with unstable pleth on O2 sat monitor.  Spoke with Dr. Delton CoombesByrum - concern for airway  protection - to transfer to 19M - will see there.  RT with RRT RN - transfer to [phone] [id number] with monitor and NRB mask - Dr. Delton SeeNelson and Dr. Thad Rangereynolds present.  Patient relaxing some - less coughing - airway seems more clear - able to speak full sentence - still with moderate distress.  Needs aggressive pulmonary toilet.  Handoff to Avery DennisonHeather RN and Dirk DressKaty Whiteheart NP with PCCM.     Event Summary: Name of Physician Notified: Dr. Noland FordyceScwartz -   Colonel Baldan Angulli PA prior to RRT at  (in room with patient)  Name of Consulting Physician Notified: Dr . Delton SeeNelson      Dr. Delton CoombesByrum at  (per Colonel Baldan Angulli PA)  Outcome: Transferred (Comment)  Event End Time: 1010 (19M01)  Delton PrairieBritt, Brando Taves L

## 2014-06-19 NOTE — Progress Notes (Signed)
INITIAL NUTRITION ASSESSMENT  DOCUMENTATION CODES Per approved criteria  -Severe malnutrition in the context of chronic illness   INTERVENTION: Initiate Vital AF 1.2 @ 25 ml/hr via NG tube and increase by 10 ml every 12 hours to goal rate of 65 ml/hr.   Tube feeding regimen provides 1872 kcal, 117 grams of protein, and 1265 ml of H2O.   Monitor magnesium, potassium, and phosphorus daily for at least 3 days, MD to replete as needed, as pt is at risk for refeeding syndrome given severe malnutrition. (labs ordered per MD).   NUTRITION DIAGNOSIS: Malnutrition related to chronic illness as evidenced by severe fat and muscle depletion.   Goal: Pt to meet >/= 90% of their estimated nutrition needs   Monitor:  Respiratory status, TF tolerance/adequacy, weight trends, labs  Reason for Assessment: Consult received to initiate and manage enteral nutrition support.  81 y.o. male  ASSESSMENT: Pt with hx of malnutrition admitted 2/18 with several months of progressive weakness, dysphagia, and weight loss. Working dx of myasthenia gravis, tx to CIR 2/24 and started on plasma pheresis. On 2/25 pt developed increased weakness and afib with RVR. Pt tx to Neuro ICU. Pt now NPO due to concern for aspiration and may need intubation. Pt with NG tube placed, xray pending.  Pt sleepy, introduced myself and what I was here for. Pt did not respond much. Unable to answer questions.  Per RD previously following pt, pt was not eating but trying to drink supplements as offered. RD feels pt was drinking at least 2 per day and possibly up to 4 a day.   Nutrition Focused Physical Exam:  Subcutaneous Fat:  Orbital Region: severe depletion Upper Arm Region: severe depletion Thoracic and Lumbar Region: severe depletion  Muscle:  Temple Region: severe depletion Clavicle Bone Region: severe depletion Clavicle and Acromion Bone Region: severe depletion Scapular Bone Region: severe depletion Dorsal Hand: severe  depletion Patellar Region: severe depletion Anterior Thigh Region: severe depletion Posterior Calf Region: edematous  Edema: present +2   Height: Ht Readings from Last 1 Encounters:  06/18/14 5\' 9"  (1.753 m)    Weight: Wt Readings from Last 1 Encounters:  06/18/14 133 lb 6.4 oz (60.51 kg)    Ideal Body Weight: 72.7 kg   % Ideal Body Weight: 83%  Wt Readings from Last 10 Encounters:  06/18/14 133 lb 6.4 oz (60.51 kg)  06/12/14 131 lb (59.421 kg)    Usual Body Weight: 162 lb   % Usual Body Weight: 82%  BMI:  There is no weight on file to calculate BMI.  Estimated Nutritional Needs: Kcal: 1800-2000 Protein: 90-110 grams Fluid: > 1.8 L/day  Skin: ecchymosis   Diet Order: Diet NPO time specified  EDUCATION NEEDS: -No education needs identified at this time  No intake or output data in the 24 hours ending 06/05/2014 1119  Last BM: PTA   Labs:   Recent Labs Lab 06/14/14 0550 06/17/14 0924 06/17/14 1503 06/18/14 1800 06/20/2014 0537  NA 137 132* 136  --  138  K 4.1 4.4 4.3  --  3.9  CL 108 103 99  --  107  CO2 26 26  --   --  22  BUN 24* 27* 29*  --  29*  CREATININE 1.30 1.31 1.40* 1.30 1.30  CALCIUM 9.3 8.9  --   --  9.0  GLUCOSE 122* 123* 100*  --  112*    CBG (last 3)  No results for input(s): GLUCAP in the last 72  hours.  Scheduled Meds: . famotidine  20 mg Per Tube Daily  . feeding supplement (VITAL HIGH PROTEIN)  1,000 mL Per Tube Q24H  . heparin  5,000 Units Subcutaneous 3 times per day  . polyethylene glycol  17 g Oral Daily  . pyridostigmine  90 mg Per Tube TID    Continuous Infusions: . diltiazem (CARDIZEM) infusion      Past Medical History  Diagnosis Date  . Hypertension   . Carotid artery disease   . Weight loss, unintentional   . Protein-calorie malnutrition, moderate     Past Surgical History  Procedure Laterality Date  . Hernia repair      Bilateral  . Prostate biopsy      WNL  . Hemorrhoid surgery    . Cataract  extraction      Kendell Bane RD, LDN, CNSC [phone] Pager [phone] After Hours Pager

## 2014-06-19 NOTE — Progress Notes (Signed)
  Echocardiogram 2D Echocardiogram has been performed.  Leta JunglingCooper, Charnise Lovan M 10/01/14, 12:29 PM

## 2014-06-19 NOTE — Significant Event (Addendum)
This morning patient given sorbitol by pervious nurse thereafter patient with a weak cough and with difficulty to clear.  Yankeurs suction set up at bedside, patient able to self manage. Speech therapist on initial evaluation noted HR 140's and O2 sats 98% on room air. Notified Kirsteins, MD given order for EKG. Obtained EKG then notified Dan A. PA also made aware of patient's difficulty to clear.  Felicie Mornavid Smith PA to room. Called Respiratory with stat orders. Called Rapid Response nurse given instructions to place a NRB mask, and maintain upright position. Dr. Thad Rangereynolds to room. Patient MAE x 4, and using suction to assist with clearing. Dr. Delton SeeNelson cardiology to room. Given order for 2 doses of 5 mg of Diltiazem IV per Dr. Delton SeeNelson. BP maintained(see rapid response noted for vital signs) given 3rd dose of Diltiazem (total of 15 mg). Patient with less coughing. Patient transferring to 3 M, handoff to Best BuyHeather RN. Patient's family notified.

## 2014-06-19 NOTE — Progress Notes (Signed)
Subjective:  Patient was transferred to CIR. Upon entering room this AM he was noted to have significantly weak cough, new onset Afib with HR 143, difficulty clearing secretions. Concern was for aspiration after medication administration this morning.  Complaints of shortness of breath.  Patient clearly having difficulty protecting his airway.    Objective: Current vital signs: There were no vitals taken for this visit. Vital signs in last 24 hours: Temp:  [97.5 F (36.4 C)-99 F (37.2 C)] 98.2 F (36.8 C) (02/25 4098) Pulse Rate:  [74-90] 74 (02/25 0613) Resp:  [18] 18 (02/25 0613) BP: (157-183)/(75-90) 183/75 mmHg (02/25 0613) SpO2:  [95 %-100 %] 98 % (02/25 0613) Weight:  [60.51 kg (133 lb 6.4 oz)] 60.51 kg (133 lb 6.4 oz) (02/24 1726)  Intake/Output from previous day:   Intake/Output this shift:   Nutritional status: Diet NPO time specified  Neurologic Exam:  Mental Status: Alert, oriented, speech clear with no aphasia.  Coughing and having SOB.  Cranial Nerves: II: Visual fields grossly normal, pupils equal, round, reactive to light and accommodation III,IV, VI: ptosis present left eye, extra-ocular motions intact bilaterally V,VII: smile symmetric, facial light touch sensation normal bilaterally VIII: hearing normal bilaterally IX,X: gag reflex present--weak cough needing constant suctioning XI: bilateral shoulder shrug XII: midline tongue extension without atrophy or fasciculations  Motor: Right :Upper extremity 4-/5 deltoidsLeft: Upper extremity 4-/5 deltoids 4/5 bicep/tricep4/5 bicep/tricep  4/5 Hand grip4/5 hand  grip Lower extremity 4-/5 Lower extremity 4-/5 Tone and bulk:normal tone throughout; no atrophy noted Sensory: Pinprick and light touch intact throughout, bilaterally Deep Tendon Reflexes:  1+ throughout with no AJ Plantars: Right: downgoingLeft: downgoing Cerebellar: normal finger-to-nose,    Lab Results: Basic Metabolic Panel:  Recent Labs Lab 06/12/14 1331 06/13/14 1010 06/14/14 0550 06/17/14 0924 06/17/14 1503 06/18/14 1800 July 18, 2014 0537  NA 139 139 137 132* 136  --  138  K 4.0 4.0 4.1 4.4 4.3  --  3.9  CL 105 107 108 103 99  --  107  CO2 --   --  22  GLUCOSE 93 132* 122* 123* 100*  --  112*  BUN 33* 29* 24* 27* 29*  --  29*  CREATININE 1.49* 1.37* 1.30 1.31 1.40* 1.30 1.30  CALCIUM 9.8 9.5 9.3 8.9  --   --  9.0    Liver Function Tests:  Recent Labs Lab 06/12/14 1331 06/17/14 0924 2014-07-18 0537  AST 113* 86* 82*  ALT 75* 61* 56*  ALKPHOS 36* 39 28*  BILITOT 0.7 0.9 1.0  PROT 6.2 6.8 5.8*  ALBUMIN 2.5* 2.1* 2.6*   No results for input(s): LIPASE, AMYLASE in the last 168 hours. No results for input(s): AMMONIA in the last 168 hours.  CBC:  Recent Labs Lab 06/13/14 1010 06/14/14 0550 06/17/14 1503 06/17/14 1630 06/18/14 1800 07/18/2014 0537  WBC 4.3 4.9  --  4.8 8.1 7.2  NEUTROABS  --   --   --   --   --  4.8  HGB 12.4* 12.7* 13.6 12.1* 12.1* 11.4*  HCT 39.2 40.3 40.0 37.2* 37.2* 35.2*  MCV 93.3 94.6  --  91.6 93.0 93.4  PLT 154 150  --  155 133* 128*    Cardiac Enzymes: No results for input(s): CKTOTAL, CKMB, CKMBINDEX, TROPONINI in the last 168 hours.  Lipid Panel: No results for input(s): CHOL, TRIG, HDL, CHOLHDL, VLDL, LDLCALC in the last 168 hours.  CBG: No results for input(s): GLUCAP in the last 168  hours.  Microbiology: No results found for this or any previous  visit.  Coagulation Studies: No results for input(s): LABPROT, INR in the last 72 hours.  Imaging: Dg Chest Port 1 View  05/31/2014   CLINICAL DATA:  Respiratory failure, shortness of breath, congestion  EXAM: PORTABLE CHEST - 1 VIEW  COMPARISON:  06/16/2014  FINDINGS: Cardiomegaly again noted. Central mild vascular congestion without pulmonary edema. Left IJ catheter is unchanged is position. Stable small left pleural effusion with left basilar atelectasis or infiltrate.  IMPRESSION: No significant change. Central mild vascular congestion without pulmonary edema. Small left pleural effusion with left basilar atelectasis or infiltrate.   Electronically Signed   By: Natasha MeadLiviu  Pop M.D.   On: 06/07/2014 10:23    Medications:  Scheduled: . benazepril 20 mg Oral Daily  . feeding supplement (ENSURE COMPLETE) 237 mL Oral BID BM  . finasteride 5 mg Oral Daily  . heparin 3,000 Units Intracatheter Once  . heparin 5,000 Units Subcutaneous 3 times per day  . lactose free nutrition 237 mL Oral BID WC  . polyethylene glycol 17 g Oral BID  . pyridostigmine 60 mg Oral 3 times per day  . sodium chloride 1,000 mL Intravenous Once  . sodium chloride 3 mL Intravenous Q12H       Felicie MornDavid Smith PA-C [facility] [phone]  06/12/2014, 10:32 AM  Patient seen and examined.  Clinical course and management discussed.  Necessary edits performed.  I agree with the above.  Assessment and plan of care developed and discussed below.    Assessment/Plan:  81 YO male admitted for management of MG. He has received 4/5 IVIg treatments with no significantimprovement noted.Patient continues to have difficulty protecting his airway.  Concern this AM is for acute aspiration that may have initiated atrial fibrillation as well.  Patient not rate controlled.   Antibodies for striated muscle and binding Ach receptor antibodies were negative. Remainder of  myasthenia gravis panel 2 is pending. Underwent first TPE treatment 2/23 with planned second PLX today.   NIF -30, VC 850 this AM.   Recommendations: 1.  Cardizem 5mg  IV now 2.  Cardiology consult 3.  CCM consult for evaluation for need of intubation and transfer from CIR to NICU 4.  O2 NRB.   5.  Patient will be obtaining a Panda tube for administration of Mestinon and close observation  6.  STAT CXR  This patient is critically ill and at significant risk of neurological worsening, death and care requires constant monitoring of vital signs, hemodynamics,respiratory and cardiac monitoring, neurological assessment, discussion with family, other specialists and medical decision making of high complexity. I spent 45 minutes of neurocritical care time  in the care of  this patient.  Thana FarrLeslie Graviel Payeur, MD [facility] [phone] 06/20/2014  10:38 AM

## 2014-06-19 NOTE — Discharge Summary (Signed)
Discharge summary job # [id number]

## 2014-06-19 NOTE — Consult Note (Signed)
CARDIOLOGY CONSULT NOTE   Patient ID: [name] MRN: [id number], DOB/AGE: Aug 06, 1928   Admit date: 06/18/2014 Date of Consult: 06/10/2014  Primary Physician: Marylynn Pearson, MD Primary Cardiologist: no prior/Bronislaus Verdell, Faustino Congress   Reason for consult:  A-fib with RVR  Problem List  Past Medical History  Diagnosis Date  . Hypertension   . Carotid artery disease   . Weight loss, unintentional   . Protein-calorie malnutrition, moderate     Past Surgical History  Procedure Laterality Date  . Hernia repair      Bilateral  . Prostate biopsy      WNL  . Hemorrhoid surgery    . Cataract extraction       Allergies  Allergies  Allergen Reactions  . Sulfa Antibiotics Itching    HPI   81 year old gentleman with prior medical history of hypertension, peripheral vascular disease, and recently diagnosed myasthenia gravis that was hospitalized in inpatient rehabilitation and undergoing plasmapheresis for myasthenia gravis. The patient developed cough yesterday that was noticed to be worse today. This morning during rehabilitation session he developed acute shortness of breath and was found to be in rapid ventricular rate. EKG showed atrial fibrillation with heart rate anywhere from 140-200 bpm. The patient was started on nonrebreather with 15 L of oxygen. Cardiology was consulted, 50 mg of IV diltiazem was given bedside with minimal improvement in heart rate. The patient continues to deteriorate with significant respiratory distress and hypoxic respiratory failure with O2 sat 79%. Pulmonary and critical care was called and the patient was transferred to medical ICU for intubation. The patient states that he has never had any history of heart disease specifically heart failure or heart attack. He denies any chest pain.  Inpatient Medications  . therapeutic plasma exchange solution   Dialysis Q1 Hr x 3  . benazepril  20 mg Oral Daily  . calcium gluconate IVPB  4 g Intravenous Once    . diltiazem  5 mg Intravenous Once  . feeding supplement (ENSURE COMPLETE)  237 mL Oral BID BM  . finasteride  5 mg Oral Daily  . heparin  5,000 Units Subcutaneous 3 times per day  . lactose free nutrition  237 mL Oral BID WC  . polyethylene glycol  17 g Oral BID    Family History Family History  Problem Relation Age of Onset  . Parkinson's disease Brother     Deceased - 105  . Heart disease Sister     Deceased - 4  . Heart attack Brother     Deceased - 78  . Stroke Brother   . Heart disease Mother      Social History History   Social History  . Marital Status: Unknown    Spouse Name: N/A  . Number of Children: N/A  . Years of Education: N/A   Occupational History  . Not on file.   Social History Main Topics  . Smoking status: Former Smoker    Quit date: 06/12/1970  . Smokeless tobacco: Not on file  . Alcohol Use: No  . Drug Use: No  . Sexual Activity: No   Other Topics Concern  . Not on file   Social History Narrative     Review of Systems  General:  No chills, fever, night sweats or weight changes.  Cardiovascular:  No chest pain, dyspnea on exertion, edema, orthopnea, palpitations, paroxysmal nocturnal dyspnea. Dermatological: No rash, lesions/masses Respiratory: No cough, dyspnea Urologic: No hematuria, dysuria Abdominal:   No nausea, vomiting, diarrhea,  bright red blood per rectum, melena, or hematemesis Neurologic:  No visual changes, wkns, changes in mental status. All other systems reviewed and are otherwise negative except as noted above.  Physical Exam  Blood pressure 183/75, pulse 74, temperature 98.2 F (36.8 C), temperature source Oral, resp. rate 18, height 5\' 9"  (1.753 m), weight 133 lb 6.4 oz (60.51 kg), SpO2 98 %.  General: In significant respiratory distress, gasping for breath on nonrebreather with 50 L of oxygen. Psych: Normal affect. Neuro: Alert and oriented X 3. HEENT: Normal  Neck: Supple without bruits or JVD. Lungs:  Resp  regular and unlabored, CTA. Heart: RRR no s3, s4, or murmurs, 4/6 holosystolic murmur.. Abdomen: Soft, non-tender, non-distended, BS + x 4.  Extremities: No clubbing, cyanosis or edema. DP/PT/Radials 2+ and equal bilaterally.  Labs  No results for input(s): CKTOTAL, CKMB, TROPONINI in the last 72 hours. Lab Results  Component Value Date   WBC 7.2 05/27/2014   HGB 11.4* 06/02/2014   HCT 35.2* 06/05/2014   MCV 93.4 06/18/2014   PLT 128* 06/18/2014    Recent Labs Lab 06/21/2014 0537  NA 138  K 3.9  CL 107  CO2 22  BUN 29*  CREATININE 1.30  CALCIUM 9.0  PROT 5.8*  BILITOT 1.0  ALKPHOS 28*  ALT 56*  AST 82*  GLUCOSE 112*    Radiology/Studies  Mr Brain Wo Contrast  06/13/2014   CLINICAL DATA:  Progressive generalized weakness with diplopia, dysphagia, and bilateral ptosis. Suspected myasthenia gravis. Initial evaluation.    IMPRESSION: 1. No acute intracranial abnormality. 2. Moderate generalized cerebral atrophy.   Electronically Signed   By: Rise MuBenjamin  McClintock M.D.   On: 06/13/2014 00:49   Dg Chest Port 1 View  06/16/2014   CLINICAL DATA:  81 year old male status post central line placement, positioning. Initial encounter.  EXAM: PORTABLE CHEST - 1 VIEW  COMPARISON:  06/12/2014 and earlier.  FINDINGS: Portable AP upright view at 1535 hours. Left IJ approach central line now in place. Tip projects at the cavoatrial junction level. Lower lung volumes. Stable cardiac size and mediastinal contours. No pneumothorax. Increased interstitial opacity, in part felt due to crowding. Increased retrocardiac opacity, Suggestion of small left pleural effusion.  IMPRESSION: 1. Left IJ approach central line placed, tip at the cavoatrial junction level. 2. Lower lung volumes with increased crowding of markings and possible vascular congestion. 3. Increased lung base opacity suspected due to left pleural effusion.     Echocardiogram - none  ECG: A-fib with RVR with RBBB    ASSESSMENT AND  PLAN  81 year old gentleman with history of myasthenia gravis and no prior history of cardiac disease  1. New onset atrial fibrillation with rapid ventricular response - most probably secondary to aspiration pneumonia that started yesterday. - Start Cardizem drip - Start heparin IV as we don't know the length of atrial fibrillation, there is no prior EKG. - Order stat echocardiogram to evaluate for left ventricular function and left atrial size. - This is believed to be driven by upper respiratory infection it should resolve once it's treated.  2. Acute hypoxic respiratory failure - secondary to A. fib with RVR and possible aspiration pneumonia - Patient transferred to medical ICU, pulmonary critical care service consulted for possible intubation. - Stat chest x-ray  3. Hypertension - for now Cardizem drip, we will follow.  Signed, Lars MassonNELSON, Goldia Ligman H, MD, [facility] 06/18/2014, 9:19 AM

## 2014-06-19 NOTE — Progress Notes (Signed)
NIF -60 VC 1000. Pt had very good effort

## 2014-06-19 NOTE — Progress Notes (Signed)
NIF -18 to -30, VC 840

## 2014-06-20 ENCOUNTER — Encounter (HOSPITAL_COMMUNITY): Payer: Self-pay | Admitting: *Deleted

## 2014-06-20 ENCOUNTER — Inpatient Hospital Stay (HOSPITAL_COMMUNITY): Payer: Medicare Other

## 2014-06-20 DIAGNOSIS — G7000 Myasthenia gravis without (acute) exacerbation: Secondary | ICD-10-CM

## 2014-06-20 DIAGNOSIS — I3139 Other pericardial effusion (noninflammatory): Secondary | ICD-10-CM | POA: Clinically undetermined

## 2014-06-20 DIAGNOSIS — I313 Pericardial effusion (noninflammatory): Secondary | ICD-10-CM | POA: Clinically undetermined

## 2014-06-20 LAB — MAGNESIUM: MAGNESIUM: 1.8 mg/dL (ref 1.5–2.5)

## 2014-06-20 LAB — BASIC METABOLIC PANEL
Anion gap: 7 (ref 5–15)
BUN: 31 mg/dL — AB (ref 6–23)
CALCIUM: 8.7 mg/dL (ref 8.4–10.5)
CO2: 21 mmol/L (ref 19–32)
Chloride: 110 mmol/L (ref 96–112)
Creatinine, Ser: 1.26 mg/dL (ref 0.50–1.35)
GFR calc Af Amer: 59 mL/min — ABNORMAL LOW (ref >90)
GFR, EST NON AFRICAN AMERICAN: 51 mL/min — AB (ref >90)
Glucose, Bld: 164 mg/dL — ABNORMAL HIGH (ref 70–99)
Potassium: 3.6 mmol/L (ref 3.5–5.1)
Sodium: 138 mmol/L (ref 135–145)

## 2014-06-20 LAB — CBC
HCT: 35.8 % — ABNORMAL LOW (ref 39.0–52.0)
Hemoglobin: 11.5 g/dL — ABNORMAL LOW (ref 13.0–17.0)
MCH: 29.3 pg (ref 26.0–34.0)
MCHC: 32.1 g/dL (ref 30.0–36.0)
MCV: 91.3 fL (ref 78.0–100.0)
PLATELETS: 135 10*3/uL — AB (ref 150–400)
RBC: 3.92 MIL/uL — AB (ref 4.22–5.81)
RDW: 17 % — ABNORMAL HIGH (ref 11.5–15.5)
WBC: 10.9 10*3/uL — AB (ref 4.0–10.5)

## 2014-06-20 LAB — GLUCOSE, CAPILLARY
GLUCOSE-CAPILLARY: 115 mg/dL — AB (ref 70–99)
GLUCOSE-CAPILLARY: 116 mg/dL — AB (ref 70–99)
Glucose-Capillary: 141 mg/dL — ABNORMAL HIGH (ref 70–99)
Glucose-Capillary: 121 mg/dL — ABNORMAL HIGH (ref 70–99)
Glucose-Capillary: 140 mg/dL — ABNORMAL HIGH (ref 70–99)
Glucose-Capillary: 119 mg/dL — ABNORMAL HIGH (ref 70–99)

## 2014-06-20 LAB — ACETYLCHOLINE RECEPTOR AB, ALL
Acety choline binding ab: <0.03 nmol/L (ref 0.00–0.24)
Acetylchol Block Ab: 18 % (ref 0–25)

## 2014-06-20 LAB — PHOSPHORUS: PHOSPHORUS: 3.7 mg/dL (ref 2.3–4.6)

## 2014-06-20 MED ORDER — ACETAMINOPHEN 160 MG/5ML PO SOLN
650.0000 mg | Freq: Four times a day (QID) | ORAL | Status: DC | PRN
  Administered 2014-06-20 – 2014-06-21 (×2): 650 mg
  Filled 2014-06-20: qty 20.3

## 2014-06-20 MED ORDER — ACETAMINOPHEN 160 MG/5ML PO SOLN
325.0000 mg | ORAL | Status: DC | PRN
  Filled 2014-06-20: qty 20.3

## 2014-06-20 MED ORDER — FUROSEMIDE 10 MG/ML IJ SOLN
40.0000 mg | Freq: Once | INTRAMUSCULAR | Status: AC
  Administered 2014-06-20: 40 mg via INTRAVENOUS
  Filled 2014-06-20: qty 4

## 2014-06-20 NOTE — Progress Notes (Signed)
VC 1.6, NIF-

## 2014-06-20 NOTE — Progress Notes (Signed)
SUBJECTIVE:  The patient is sitting in a chair. He is weak and frail. Two-dimensional echo yesterday showed normal ejection fraction is 65%. The echo also shows a pericardial effusion that is moderate. However he does not have any significant hemodynamic effect.  Filed Vitals:   06/20/14 0630 06/20/14 0645 06/20/14 0700 06/20/14 0800  BP: 123/49 112/68 129/54 111/58  Pulse: 73 75 77 65  Temp:    97.4 F (36.3 C)  TempSrc:    Oral  Resp: 25 28 24 20   Height:      Weight:      SpO2: 99% 100% 96% 99%     Intake/Output Summary (Last 24 hours) at 06/20/14 0955 Last data filed at 06/20/14 0900  Gross per 24 hour  Intake 1161.54 ml  Output    555 ml  Net 606.54 ml    LABS: Basic Metabolic Panel:  Recent Labs  24/40/10 0537  06/07/2014 1510 06/20/14 0600  NA 138  --  142 138  K 3.9  --  3.8 3.6  CL 107  --  106 110  CO2 22  --   --  21  GLUCOSE 112*  --  116* 164*  BUN 29*  --  29* 31*  CREATININE 1.30  < > 1.30 1.26  CALCIUM 9.0  --   --  8.7  MG  --   --   --  1.8  PHOS  --   --   --  3.7  < > = values in this interval not displayed. Liver Function Tests:  Recent Labs  06/02/2014 0537  AST 82*  ALT 56*  ALKPHOS 28*  BILITOT 1.0  PROT 5.8*  ALBUMIN 2.6*   No results for input(s): LIPASE, AMYLASE in the last 72 hours. CBC:  Recent Labs  06/05/2014 0537 06/13/2014 1225 06/08/2014 1510 06/20/14 0600  WBC 7.2 7.4  --  10.9*  NEUTROABS 4.8  --   --   --   HGB 11.4* 12.2* 12.6* 11.5*  HCT 35.2* 38.5* 37.0* 35.8*  MCV 93.4 92.8  --  91.3  PLT 128* 129*  --  135*   Cardiac Enzymes: No results for input(s): CKTOTAL, CKMB, CKMBINDEX, TROPONINI in the last 72 hours. BNP: Invalid input(s): POCBNP D-Dimer: No results for input(s): DDIMER in the last 72 hours. Hemoglobin A1C: No results for input(s): HGBA1C in the last 72 hours. Fasting Lipid Panel: No results for input(s): CHOL, HDL, LDLCALC, TRIG, CHOLHDL, LDLDIRECT in the last 72 hours. Thyroid Function  Tests: No results for input(s): TSH, T4TOTAL, T3FREE, THYROIDAB in the last 72 hours.  Invalid input(s): FREET3  RADIOLOGY: Mr Brain Wo Contrast  06/13/2014   CLINICAL DATA:  Progressive generalized weakness with diplopia, dysphagia, and bilateral ptosis. Suspected myasthenia gravis. Initial evaluation.  EXAM: MRI HEAD WITHOUT CONTRAST  TECHNIQUE: Multiplanar, multiecho pulse sequences of the brain and surrounding structures were obtained without intravenous contrast.  COMPARISON:  None.  FINDINGS: Diffuse prominence of the CSF containing spaces is compatible with generalized cerebral atrophy. No significant white matter changes present. No chronic infarction.  No abnormal foci of restricted diffusion to suggest acute intracranial infarct. Gray-white matter differentiation maintained. Normal intravascular flow voids present. No acute or chronic intracranial hemorrhage.  No mass lesion or midline shift. No hydrocephalus. No extra-axial fluid collection.  Craniocervical junction normal. Pituitary gland within normal limits. No acute abnormality seen about the orbits.  Paranasal sinuses and mastoid air cells are clear.  Mild degenerative changes noted within the  upper cervical spine. Bone marrow signal intensity normal. Scalp soft tissues within normal limits.  IMPRESSION: 1. No acute intracranial abnormality. 2. Moderate generalized cerebral atrophy.   Electronically Signed   By: Rise MuBenjamin  McClintock M.D.   On: 06/13/2014 00:49   Dg Chest Port 1 View  06/20/2014   CLINICAL DATA:  Respiratory failure  EXAM: PORTABLE CHEST - 1 VIEW  COMPARISON:  June 19, 2014  FINDINGS: Central catheter tip is in the superior vena cava. Feeding tube tip is in the stomach. No pneumothorax. There is persistent consolidation in the left lower lobe with clearing left effusion. There is a small right effusion as well. Lungs elsewhere clear. Heart is enlarged. There appears to be a degree of pulmonary venous hypertension. No  adenopathy.  IMPRESSION: Tube and catheter positions as described without pneumothorax. Persistent left lower lobe consolidation. There are bilateral pleural effusions, larger on the left than on the right. Stable cardiomegaly with a degree of pulmonary venous hypertension. There is felt to be a degree of underlying congestive heart failure.   Electronically Signed   By: Bretta BangWilliam  Woodruff III M.D.   On: 06/20/2014 07:40   Dg Chest Port 1 View  2014/05/11   CLINICAL DATA:  Respiratory failure, shortness of breath, congestion  EXAM: PORTABLE CHEST - 1 VIEW  COMPARISON:  06/16/2014  FINDINGS: Cardiomegaly again noted. Central mild vascular congestion without pulmonary edema. Left IJ catheter is unchanged is position. Stable small left pleural effusion with left basilar atelectasis or infiltrate.  IMPRESSION: No significant change. Central mild vascular congestion without pulmonary edema. Small left pleural effusion with left basilar atelectasis or infiltrate.   Electronically Signed   By: Natasha MeadLiviu  Pop M.D.   On: 02016/01/17 10:23   Dg Chest Port 1 View  06/16/2014   CLINICAL DATA:  81 year old male status post central line placement, positioning. Initial encounter.  EXAM: PORTABLE CHEST - 1 VIEW  COMPARISON:  06/12/2014 and earlier.  FINDINGS: Portable AP upright view at 1535 hours. Left IJ approach central line now in place. Tip projects at the cavoatrial junction level. Lower lung volumes. Stable cardiac size and mediastinal contours. No pneumothorax. Increased interstitial opacity, in part felt due to crowding. Increased retrocardiac opacity, Suggestion of small left pleural effusion.  IMPRESSION: 1. Left IJ approach central line placed, tip at the cavoatrial junction level. 2. Lower lung volumes with increased crowding of markings and possible vascular congestion. 3. Increased lung base opacity suspected due to left pleural effusion.   Electronically Signed   By: Odessa FlemingH  Hall M.D.   On: 06/16/2014 15:54   Dg Chest  Port 1 View  06/12/2014   CLINICAL DATA:  Cough. Questionable aspiration. Generalized weakness.  EXAM: PORTABLE CHEST - 1 VIEW  COMPARISON:  Chest CT, 12/17/2013.  FINDINGS: The heart size and mediastinal contours are within normal limits. Both lungs are clear. The visualized skeletal structures are unremarkable.  IMPRESSION: No active disease.   Electronically Signed   By: Amie Portlandavid  Ormond M.D.   On: 06/12/2014 16:01   Dg Abd Portable 1v  2014/05/11   CLINICAL DATA:  Feeding tube placement  EXAM: PORTABLE ABDOMEN - 1 VIEW  COMPARISON:  None.  FINDINGS: The bowel gas pattern is normal. These NG feeding tube with tip in proximal stomach. Residual contrast material noted within colon. Distal colonic diverticula are noted.  IMPRESSION: NG feeding tube with tip in proximal stomach.   Electronically Signed   By: Natasha MeadLiviu  Pop M.D.   On: 02016/01/17 12:17  Dg Swallowing Func-speech Pathology  06/13/2014    Objective Swallowing Evaluation:    Patient Details  Name: ACEY WOODFIELD MRN: [id number] Date of Birth: 19-Aug-1928  Today's Date: 06/13/2014 Time: SLP Start Time (ACUTE ONLY): 0815-SLP Stop Time (ACUTE ONLY): 0850 SLP Time Calculation (min) (ACUTE ONLY): 35 min  Past Medical History:  Past Medical History  Diagnosis Date  . Hypertension   . Carotid artery disease   . Weight loss, unintentional   . Protein-calorie malnutrition, moderate    Past Surgical History:  Past Surgical History  Procedure Laterality Date  . Hernia repair      Bilateral  . Prostate biopsy      WNL  . Hemorrhoid surgery    . Cataract extraction     HPI:  HPI: [name] is a 81 y.o. male with a past medical history of  hypertension, gastroesophageal reflux disease, presenting as a transfer  from his neurologist office. Patient having progressive generalized  weakness that started 6 months to 1 year ago, that has been associated  with diplopia, dysphasia, and bilateral ptosis. Patient states that  symptoms occurring throughout the day however on  occasion have been worse  at nighttime. He reports having multiple falls in in the past with his  last fall being one month ago. He also reports a 20 pound weight loss  throughout this period, attributing this to minimal by mouth intake  stating he cannot have solid foods. He was evaluated by his neurologist on  day of admit felt that symptoms likely secondary to myasthenia gravis.   MRI brain and CXR negative.    No Data Recorded  Assessment / Plan / Recommendation CHL IP CLINICAL IMPRESSIONS 06/13/2014  Dysphagia Diagnosis Moderate oral phase dysphagia;Severe pharyngeal phase  dysphagia;Severe cervical esophageal phase dysphagia  Clinical impression Moderate oral and severe pharyngo-cervical esophageal  dysphagia with significant weak muscular contraction.  Said weakness  results in severe pharyngeal stasis across all consistencies that mix with  secretions retained.  Liquid swallows faciliated clearance but resulted in  mild aspiration of thin with sensation.  Pt is able to expectorate to  clear vallecular residuals he cannot clear into esophagus.  Pt  expectorated viscous secretions throughout entire evaluation-retained  secretions he is unable to swallow.   Pt is a high aspiration risk with  all intake but apparently has been tolerating given his negative CXR.  He  does report issues with excessive secretions for the last few months.  MD  may desire to allow pt to continue po intake.  Concern for pt to be able  to support self nutritionally at this time due to level of dysphagia.        CHL IP TREATMENT RECOMMENDATION 06/13/2014  Treatment Plan Recommendations Therapy as outlined in treatment plan below      CHL IP DIET RECOMMENDATION 06/13/2014  Diet Recommendations Ice chips PRN after oral care;Dysphagia 2 (Fine  chop);Thin liquid  Liquid Administration via Cup;Straw  Medication Administration Crushed with puree  Compensations Slow rate;Small sips/bites;Multiple dry swallows after each  bite/sip;Follow solids  with liquid;Effortful swallow;Hard cough after  swallow  Postural Changes and/or Swallow Maneuvers Seated upright 90  degrees;Upright 30-60 min after meal     CHL IP OTHER RECOMMENDATIONS 06/13/2014  Recommended Consults (None)  Oral Care Recommendations Oral care BID  Other Recommendations (None)     CHL IP FOLLOW UP RECOMMENDATIONS 06/13/2014  Follow up Recommendations None     CHL IP FREQUENCY AND DURATION 06/13/2014  Speech Therapy Frequency (ACUTE ONLY) (None)  Treatment Duration 2 weeks      CHL IP REASON FOR REFERRAL 06/13/2014  Reason for Referral Objectively evaluate swallowing function     CHL IP ORAL PHASE 06/13/2014  Lips (None)  Tongue (None)  Mucous membranes (None)  Nutritional status (None)  Other (None)  Oxygen therapy (None)  Oral Phase Impaired  Oral - Pudding Teaspoon (None)  Oral - Pudding Cup (None)  Oral - Honey Teaspoon (None)  Oral - Honey Cup (None)  Oral - Honey Syringe (None)  Oral - Nectar Teaspoon (None)  Oral - Nectar Cup Weak lingual manipulation;Delayed oral transit;Reduced  posterior propulsion;Piecemeal swallowing  Oral - Nectar Straw (None)  Oral - Nectar Syringe (None)  Oral - Ice Chips (None)  Oral - Thin Teaspoon Weak lingual manipulation;Delayed oral  transit;Reduced posterior propulsion;Piecemeal swallowing  Oral - Thin Cup Weak lingual manipulation;Delayed oral transit;Reduced  posterior propulsion;Piecemeal swallowing  Oral - Thin Straw (None)  Oral - Thin Syringe (None)  Oral - Puree Weak lingual manipulation;Delayed oral transit;Reduced  posterior propulsion  Oral - Mechanical Soft Weak lingual manipulation;Delayed oral  transit;Reduced posterior propulsion  Oral - Regular (None)  Oral - Multi-consistency (None)  Oral - Pill (None)  Oral Phase - Comment (None)      CHL IP PHARYNGEAL PHASE 06/13/2014  Pharyngeal Phase Impaired  Pharyngeal - Pudding Teaspoon (None)  Penetration/Aspiration details (pudding teaspoon) (None)  Pharyngeal - Pudding Cup (None)  Penetration/Aspiration  details (pudding cup) (None)  Pharyngeal - Honey Teaspoon (None)  Penetration/Aspiration details (honey teaspoon) (None)  Pharyngeal - Honey Cup (None)  Penetration/Aspiration details (honey cup) (None)  Pharyngeal - Honey Syringe (None)  Penetration/Aspiration details (honey syringe) (None)  Pharyngeal - Nectar Teaspoon (None)  Penetration/Aspiration details (nectar teaspoon) (None)  Pharyngeal - Nectar Cup Reduced pharyngeal peristalsis;Reduced epiglottic  inversion;Reduced tongue base retraction;Reduced airway/laryngeal  closure;Reduced laryngeal elevation;Pharyngeal residue -  valleculae;Pharyngeal residue - pyriform sinuses;Reduced anterior  laryngeal mobility  Penetration/Aspiration details (nectar cup) (None)  Pharyngeal - Nectar Straw (None)  Penetration/Aspiration details (nectar straw) (None)  Pharyngeal - Nectar Syringe (None)  Penetration/Aspiration details (nectar syringe) (None)  Pharyngeal - Ice Chips (None)  Penetration/Aspiration details (ice chips) (None)  Pharyngeal - Thin Teaspoon Reduced pharyngeal peristalsis;Reduced  epiglottic inversion;Reduced tongue base retraction;Reduced  airway/laryngeal closure;Reduced laryngeal  elevation;Penetration/Aspiration during swallow;Pharyngeal residue -  valleculae;Pharyngeal residue - pyriform sinuses;Reduced anterior  laryngeal mobility  Penetration/Aspiration details (thin teaspoon) Material enters airway,  remains ABOVE vocal cords and not ejected out  Pharyngeal - Thin Cup Reduced pharyngeal peristalsis;Reduced epiglottic  inversion;Pharyngeal residue - valleculae;Pharyngeal residue - pyriform  sinuses;Trace aspiration;Penetration/Aspiration during  swallow;Penetration/Aspiration after swallow;Reduced anterior laryngeal  mobility  Penetration/Aspiration details (thin cup) Material enters airway, passes  BELOW cords and not ejected out despite cough attempt by patient  Pharyngeal - Thin Straw (None)  Penetration/Aspiration details (thin straw) (None)   Pharyngeal - Thin Syringe (None)  Penetration/Aspiration details (thin syringe') (None)  Pharyngeal - Puree Reduced pharyngeal peristalsis;Reduced epiglottic  inversion;Pharyngeal residue - valleculae;Reduced tongue base  retraction;Reduced airway/laryngeal closure;Reduced laryngeal  elevation;Reduced anterior laryngeal mobility  Penetration/Aspiration details (puree) (None)  Pharyngeal - Mechanical Soft Reduced pharyngeal peristalsis;Reduced  epiglottic inversion;Pharyngeal residue - valleculae;Reduced laryngeal  elevation;Reduced airway/laryngeal closure;Reduced tongue base  retraction;Reduced anterior laryngeal mobility  Penetration/Aspiration details (mechanical soft) (None)  Pharyngeal - Regular (None)  Penetration/Aspiration details (regular) (None)  Pharyngeal - Multi-consistency (None)  Penetration/Aspiration details (multi-consistency) (None)  Pharyngeal - Pill (None)  Penetration/Aspiration details (pill) (None)  Pharyngeal Comment pharyngeal  residuals *(mixing with viscous secretions)  across consistencies noted with pt awareness - liquid swallows faciliated  clearance but resulted in mild aspiration, pt is able to expectorate to  clear vallecular residuals he does not swallow     CHL IP CERVICAL ESOPHAGEAL PHASE 06/13/2014  Cervical Esophageal Phase Impaired                    Nectar Cup Reduced cricopharyngeal relaxation        Thin Teaspoon Reduced cricopharyngeal relaxation                                                                                                          Mills Koller, MS [facility] [place] SLP [phone]     Physical exam   The patient responds. Cardiac exam reveals an S1 and S2.   TELEMETRY:  I have reviewed telemetry today June 20, 2014. The rhythm is irregularly irregular. It is most probably atrial fibrillation. At times it looks like it could be flutter. The EKG yesterday showed atrial fibrillation.   ASSESSMENT AND PLAN:    Atrial fibrillation with  RVR     At this time the patient is on IV amiodarone and IV diltiazem. I would plan to continue these today. Hopefully his rhythm will begin to stabilize when his respiratory status is improved.    Acute respiratory failure    Pericardial effusion    His echo showed normal left ventricular function. There was a moderate pericardial effusion. There is no hemodynamic effect. This can be followed over time.   Willa Rough 06/20/2014 9:55 AM

## 2014-06-20 NOTE — Progress Notes (Signed)
PULMONARY / CRITICAL CARE MEDICINE   Name: [name] MRN: [id number] DOB: 01-Mar-1929    ADMISSION DATE:  07-19-2014 CONSULTATION DATE:  2/25  REFERRING MD :  Thad Ranger   CHIEF COMPLAINT:  resp distress   INITIAL PRESENTATION: 81 yo male with hx HTN, malnutrition.  Presented 2/18 with several months of progressive weakness, dysphagia, weight loss and neurologic workup concerning for myasthenia gravis. He was tx to CIR 2/24 and plasma pheresis initiated.  On 2/25 developed increased weakness, AFib with RVR and productive cough and concern for inability to protect airway.  He was tx to Neuro ICU and PCCM asked to admit.   STUDIES:  2/18 MRI brain>>> neg acute, mod generalized atrophy.  2/26 TTE> LVEF 65-70%, mild MR, LAE (mild), PAS , moderate pericardial effusion without evidence of hemodynamic compromise  SIGNIFICANT EVENTS: 2/25>> tx ICU, PLEX    SUBJECTIVE:  Feeling much better this morning, breathing comfortably, still having issues with rate control  VITAL SIGNS: Temp:  [97 F (36.1 C)-99.1 F (37.3 C)] 97.4 F (36.3 C) (02/26 0800) Pulse Rate:  [30-149] 77 (02/26 0700) Resp:  [15-34] 24 (02/26 0700) BP: (90-161)/(46-112) 129/54 mmHg (02/26 0700) SpO2:  [81 %-100 %] 96 % (02/26 0700) Weight:  [61 kg (134 lb 7.7 oz)-62.9 kg (138 lb 10.7 oz)] 62.9 kg (138 lb 10.7 oz) (02/26 0500) HEMODYNAMICS:   VENTILATOR SETTINGS:   INTAKE / OUTPUT:  Intake/Output Summary (Last 24 hours) at 06/20/14 0851 Last data filed at 06/20/14 0700  Gross per 24 hour  Intake 1028.14 ml  Output    555 ml  Net 473.14 ml    PHYSICAL EXAMINATION: General:  Lying comfortably in bed HEENT: NCAT, Panda PULM: few rhonchi bilaterally CV: Irreg irreg, systolic murmur LLSB to axilla AB: BS+, soft, nontender Ext: atrophy of musculature Neuro: A&Ox3, can lift hand/arms  LABS:  CBC  Recent Labs Lab 07-19-2014 0537 07/19/2014 1225 07/19/14 1510 06/20/14 0600  WBC 7.2 7.4  --  10.9*   HGB 11.4* 12.2* 12.6* 11.5*  HCT 35.2* 38.5* 37.0* 35.8*  PLT 128* 129*  --  135*   Coag's No results for input(s): APTT, INR in the last 168 hours. BMET  Recent Labs Lab 06/17/14 0924  2014/07/19 0537 2014-07-19 1225 07/19/2014 1510 06/20/14 0600  NA 132*  < > 138  --  142 138  K 4.4  < > 3.9  --  3.8 3.6  CL 103  < > 107  --  106 110  CO2 26  --  22  --   --  21  BUN 27*  < > 29*  --  29* 31*  CREATININE 1.31  < > 1.30 1.31 1.30 1.26  GLUCOSE 123*  < > 112*  --  116* 164*  < > = values in this interval not displayed. Electrolytes  Recent Labs Lab 06/17/14 0924 07-19-2014 0537 06/20/14 0600  CALCIUM 8.9 9.0 8.7  MG  --   --  1.8  PHOS  --   --  3.7   Sepsis Markers No results for input(s): LATICACIDVEN, PROCALCITON, O2SATVEN in the last 168 hours. ABG No results for input(s): PHART, PCO2ART, PO2ART in the last 168 hours. Liver Enzymes  Recent Labs Lab 06/17/14 0924 July 19, 2014 0537  AST 86* 82*  ALT 61* 56*  ALKPHOS 39 28*  BILITOT 0.9 1.0  ALBUMIN 2.1* 2.6*   Cardiac Enzymes No results for input(s): TROPONINI, PROBNP in the last 168 hours. Glucose  Recent Labs Lab 07/19/2014  1550 Mar 16, 2015 2023 Mar 16, 2015 2352  GLUCAP 97 119* 122*    Imaging CXr 2/26 images reviewed> bibasilar atelectasis worse, bilateral effusions  ASSESSMENT / PLAN:  PULMONARY Acute respiratory failure with hypoxemia due to inability to clear secretions> improving Aspiration Pneumonitis vs atelectasis > improving Bilateral pleural effusions> volume overload? P:   Aggressive pulm hygiene  NT suction PRN  NPO  Continue to monitor respiratory status in ICU Lasix x1 2/26 See neuro   CARDIOVASCULAR Hx HTN  AFib with RVR - new, suspect stress induced Pericardial effusion P:  Per Cardiology Cardizem gtt  Amiodarone gtt Trend troponin   RENAL No active issue  P:   F/u chem   GASTROINTESTINAL Protein calorie malnutrition  Weight loss  Dysphagia  P:   NPO, SLP when  stronger Place panda for TF and meds Aspiration precautions  Dietary consult   HEMATOLOGIC Thrombocytopenia - mild, improving P:  F/u cbc  Sq heparin   INFECTIOUS Aspiration without clear evidence of infection P:   Continue to monitor wbc and fever curve off abx   ENDOCRINE No active issue  P:   Monitor glucose on chem   NEUROLOGIC Myasthenia Gravis  P:   Neuro following; pt has received mestinon, IVIg with persistent bulbar sx Planned PLEX today #3/5 NPO  Aggressive pulm hygiene as above    FAMILY  - Updates:  No family available 2/26  - Inter-disciplinary family meet or Palliative Care meeting due by:  3/2  Heber CarolinaBrent McQuaid, MD [facility] Pager: [phone] Cell: [phone] If no response, call [phone]

## 2014-06-20 NOTE — Progress Notes (Signed)
VC 1.6L  NIF -60 Good patient effort

## 2014-06-20 NOTE — Progress Notes (Signed)
NIF -60 good effort

## 2014-06-20 NOTE — Progress Notes (Addendum)
Subjective: Patient has no major complaints.  States he still is suctioning his secretions frequently. Throat is irritated from the Panda tube.   Objective: Current vital signs: BP 111/58 mmHg  Pulse 65  Temp(Src) 97.4 F (36.3 C) (Oral)  Resp 20  Ht 5\' 9"  (1.753 m)  Wt 62.9 kg (138 lb 10.7 oz)  BMI 20.47 kg/m2  SpO2 99% Vital signs in last 24 hours: Temp:  [97 F (36.1 C)-99.1 F (37.3 C)] 97.4 F (36.3 C) (02/26 0800) Pulse Rate:  [36-149] 65 (02/26 0800) Resp:  [15-34] 20 (02/26 0800) BP: (90-161)/(46-112) 111/58 mmHg (02/26 0800) SpO2:  [81 %-100 %] 99 % (02/26 0800) Weight:  [61 kg (134 lb 7.7 oz)-62.9 kg (138 lb 10.7 oz)] 62.9 kg (138 lb 10.7 oz) (02/26 0500)  Intake/Output from previous day: 02/25 0701 - 02/26 0700 In: 1028.1 [I.V.:663.1; NG/GT:365] Out: 555 [Urine:475] Intake/Output this shift: Total I/O In: 133.4 [I.V.:63.4; NG/GT:70] Out: -  Nutritional status: Diet NPO time specified  Neurologic Exam:  Mental Status: Alert, oriented, thought content appropriate.  Speech fluent without evidence of aphasia.  Able to follow 3 step commands without difficulty. Cranial Nerves: II:  Visual fields grossly normal, pupils equal, round, reactive to light and accommodation III,IV, VI: ptosis present on the left, extra-ocular motions intact bilaterally V,VII: smile symmetric, facial light touch sensation normal bilaterally VIII: hearing normal bilaterally IX,X: gag reflex present, weak cough but stronger than on yesterday XI: bilateral shoulder shrug XII: midline tongue extension without atrophy or fasciculations  Motor: Proximal UE weak at 3+ bilaterally and 4/5 distally.  4-/5 strength in LE Sensory: Pinprick and light touch intact throughout, bilaterally Deep Tendon Reflexes:  1+ throughout with no AJ  Plantars: Right: downgoing   Left: downgoing    Lab Results: Basic Metabolic Panel:  Recent Labs Lab 06/14/14 0550 06/17/14 0924 06/17/14 1503  06/18/14 1800 02/08/15 0537 02/08/15 1225 02/08/15 1510 06/20/14 0600  NA 137 132* 136  --  138  --  142 138  K 4.1 4.4 4.3  --  3.9  --  3.8 3.6  CL 108 103 99  --  107  --  106 110  CO2 26 26  --   --  22  --   --  21  GLUCOSE 122* 123* 100*  --  112*  --  116* 164*  BUN 24* 27* 29*  --  29*  --  29* 31*  CREATININE 1.30 1.31 1.40* 1.30 1.30 1.31 1.30 1.26  CALCIUM 9.3 8.9  --   --  9.0  --   --  8.7  MG  --   --   --   --   --   --   --  1.8  PHOS  --   --   --   --   --   --   --  3.7    Liver Function Tests:  Recent Labs Lab 06/17/14 0924 02/08/15 0537  AST 86* 82*  ALT 61* 56*  ALKPHOS 39 28*  BILITOT 0.9 1.0  PROT 6.8 5.8*  ALBUMIN 2.1* 2.6*   No results for input(s): LIPASE, AMYLASE in the last 168 hours. No results for input(s): AMMONIA in the last 168 hours.  CBC:  Recent Labs Lab 06/17/14 1630 06/18/14 1800 02/08/15 0537 02/08/15 1225 02/08/15 1510 06/20/14 0600  WBC 4.8 8.1 7.2 7.4  --  10.9*  NEUTROABS  --   --  4.8  --   --   --  HGB 12.1* 12.1* 11.4* 12.2* 12.6* 11.5*  HCT 37.2* 37.2* 35.2* 38.5* 37.0* 35.8*  MCV 91.6 93.0 93.4 92.8  --  91.3  PLT 155 133* 128* 129*  --  135*    Cardiac Enzymes: No results for input(s): CKTOTAL, CKMB, CKMBINDEX, TROPONINI in the last 168 hours.  Lipid Panel: No results for input(s): CHOL, TRIG, HDL, CHOLHDL, VLDL, LDLCALC in the last 168 hours.  CBG:  Recent Labs Lab 07-05-14 1550 07-05-2014 2023 05-Jul-2014 2352  GLUCAP 97 119* 122*    Microbiology: No results found for this or any previous visit.  Coagulation Studies: No results for input(s): LABPROT, INR in the last 72 hours.  Imaging: Dg Chest Port 1 View  06/20/2014   CLINICAL DATA:  Respiratory failure  EXAM: PORTABLE CHEST - 1 VIEW  COMPARISON:  July 05, 2014  FINDINGS: Central catheter tip is in the superior vena cava. Feeding tube tip is in the stomach. No pneumothorax. There is persistent consolidation in the left lower lobe with  clearing left effusion. There is a small right effusion as well. Lungs elsewhere clear. Heart is enlarged. There appears to be a degree of pulmonary venous hypertension. No adenopathy.  IMPRESSION: Tube and catheter positions as described without pneumothorax. Persistent left lower lobe consolidation. There are bilateral pleural effusions, larger on the left than on the right. Stable cardiomegaly with a degree of pulmonary venous hypertension. There is felt to be a degree of underlying congestive heart failure.   Electronically Signed   By: Bretta Bang III M.D.   On: 06/20/2014 07:40   Dg Chest Port 1 View  05-Jul-2014   CLINICAL DATA:  Respiratory failure, shortness of breath, congestion  EXAM: PORTABLE CHEST - 1 VIEW  COMPARISON:  06/16/2014  FINDINGS: Cardiomegaly again noted. Central mild vascular congestion without pulmonary edema. Left IJ catheter is unchanged is position. Stable small left pleural effusion with left basilar atelectasis or infiltrate.  IMPRESSION: No significant change. Central mild vascular congestion without pulmonary edema. Small left pleural effusion with left basilar atelectasis or infiltrate.   Electronically Signed   By: Natasha Mead M.D.   On: Jul 05, 2014 10:23   Dg Abd Portable 1v  2014/07/05   CLINICAL DATA:  Feeding tube placement  EXAM: PORTABLE ABDOMEN - 1 VIEW  COMPARISON:  None.  FINDINGS: The bowel gas pattern is normal. These NG feeding tube with tip in proximal stomach. Residual contrast material noted within colon. Distal colonic diverticula are noted.  IMPRESSION: NG feeding tube with tip in proximal stomach.   Electronically Signed   By: Natasha Mead M.D.   On: 2014-07-05 12:17    Medications:  Scheduled: . antiseptic oral rinse  7 mL Mouth Rinse q12n4p  . chlorhexidine  15 mL Mouth Rinse BID  . famotidine  20 mg Per Tube Daily  . heparin  5,000 Units Subcutaneous 3 times per day  . polyethylene glycol  17 g Oral Daily  . pyridostigmine  90 mg Per Tube TID    Felicie Morn PA-C [facility] [phone]  06/20/2014, 10:59 AM  Patient seen and examined.  Clinical course and management discussed.  Necessary edits performed.  I agree with the above.  Assessment and plan of care developed and discussed below.   Assessment/Plan:  81 YO male admitted for management of MG. He has received 4/5 IVIg treatments with no significantimprovement noted.Patient continues to have weak cough and difficulty with secretions needing frequent suctioning. Afib has now reverted back to NSR. Antibodies for  striated muscle and binding/blocking/modulating Ach receptor antibodies were negative.  S/P 2/5 TPE treatments with third scheduled for tomorrow.  Currently on Mestinon 90 mg TID. NIF -60, VC 1.3 L this AM.   Recommendations: 1.  Continue TPE   Thana Farr, MD [facility] [phone]  06/20/2014  2:52 PM

## 2014-06-20 NOTE — Telephone Encounter (Signed)
Spoke to Anzel's daughter - her father did not show much improvement with IVIG - he has completed 2 of 5 plasmapheresis treatments.  He was moved to Neuro ICU yesterday due to an irregular heart rate but is doing much better today.  She appreciated the call.

## 2014-06-20 NOTE — Discharge Summary (Cosign Needed)
NAMHinda [name]:  [name], [name]               ACCOUNT NO.:  [id number]  MEDICAL RECORD NO.:  [id number]  LOCATION:  [id number]                        FACILITY:  [facility]  PHYSICIAN:  Erick ColaceAndrew E. Kirsteins, M.D.DATE OF BIRTH:  Jan 26, 1929  DATE OF ADMISSION:  06/12/2014 DATE OF DISCHARGE:  06/18/2014                              DISCHARGE SUMMARY   DISCHARGE DIAGNOSES: 1. Myasthenia gravis with respiratory failure, atrial fibrillation. 2. Dysphagia. 3. Hypertension. 4. Benign prostatic hypertrophy.  An 81 year old right-handed male with history of hypertension, independent prior to admission until the spring of 2015, with noted gradual onset of generalized weakness, double vision, weight loss and dysphagia.  He was followed by Neurology Service for workup of myasthenia gravis.  Presented on June 12, 2014, with increasing weakness.  MRI was negative.  Placed on IVIG x5 doses with little significant improvement with ongoing workup per Neurology Services and myasthenia gravis panel was pending.  Plasmapheresis initiated on June 17, 2014 x5 treatments.  He remained on a dysphagia 2 diet with thin liquids.  Subcutaneous Lovenox for DVT prophylaxis.  Physical and occupational therapy ongoing.  The patient was admitted for comprehensive rehab program.  PAST MEDICAL HISTORY:  See discharge diagnoses.  SOCIAL HISTORY:  Lives alone.  FUNCTIONAL HISTORY:  Prior to admission, had been independent until the spring of 2015 and now holding the furniture to ambulate.  FUNCTIONAL STATUS:  Upon admission to Rehab Services, minimal assist to ambulate 180 feet with the rolling walker, minimal guard stand pivot transfers, min-to-mod assist activities of daily living.  PHYSICAL EXAMINATION:  VITAL SIGNS:  Blood pressure 176/93, pulse 64, temperature 98, respirations 19. GENERAL:  This was an alert male.  Speech was dysarthric, but intelligible, mild ptosis and facial diplegia, persistent.  Fair worriness to  his deficits, oriented x3. LUNGS:  Decreased breath sounds.  Clear to auscultation with some upper airway rhonchi. CARDIAC:  Rate was controlled. ABDOMEN:  Soft, nontender.  Good bowel sounds.  REHABILITATION HOSPITAL COURSE:  The patient was admitted to Inpatient Rehab Services.  Therapies initiated on a 3-hour daily basis.  The following issues were addressed during the patient's rehabilitation stay.  Pertaining to [name]'s myasthenia gravis, he had completed IVIG.  Plasmapheresis initiated on June 17, 2014 x5 treatments every other day followed by Neurology Services.  He also continued on Mestinon.  Subcutaneous heparin for DVT prophylaxis.  He was on a dysphagia 2 diet with thin liquids.  Therapies were initiated, with [facility], noted elevated heart rate.  EKG revealed atrial fibrillation at 148 beats per minute.  The patient also on some distress with respirations.  Oxygen saturations were still good, greater than 90%.  Cardiology Service was consulted.  The patient continued to be compromise with respirations, difficulty with clearing secretions.  It was felt with these medical changes, he would be discharged to acute care with Critical Care Medicine consulted, question plan for intubation.  He did receive Cardizem infusion prior to his discharge from the Rehab Unit to Critical Care.  [facility] had been contacted, continued to follow.  The patient in guarded condition at time of discharge.  All  medication changes made as per Medicine team.     Mariam Dollaraniel Angiulli, P.A.   ______________________________ Erick ColaceAndrew E. Kirsteins, M.D.    DA/MEDQ  D:  06/14/2014  T:  06/20/2014  Job:  [id number]

## 2014-06-21 LAB — BASIC METABOLIC PANEL
Anion gap: 6 (ref 5–15)
BUN: 41 mg/dL — ABNORMAL HIGH (ref 6–23)
CO2: 20 mmol/L (ref 19–32)
CREATININE: 1.4 mg/dL — AB (ref 0.50–1.35)
Calcium: 8.4 mg/dL (ref 8.4–10.5)
Chloride: 112 mmol/L (ref 96–112)
GFR calc Af Amer: 52 mL/min — ABNORMAL LOW (ref >90)
GFR, EST NON AFRICAN AMERICAN: 45 mL/min — AB (ref >90)
Glucose, Bld: 135 mg/dL — ABNORMAL HIGH (ref 70–99)
Potassium: 3.6 mmol/L (ref 3.5–5.1)
SODIUM: 138 mmol/L (ref 135–145)

## 2014-06-21 LAB — POCT I-STAT, CHEM 8
BUN: 37 mg/dL — ABNORMAL HIGH (ref 6–23)
CHLORIDE: 106 mmol/L (ref 96–112)
CREATININE: 1.4 mg/dL — AB (ref 0.50–1.35)
Calcium, Ion: 1.17 mmol/L (ref 1.13–1.30)
Glucose, Bld: 144 mg/dL — ABNORMAL HIGH (ref 70–99)
HCT: 34 % — ABNORMAL LOW (ref 39.0–52.0)
Hemoglobin: 11.6 g/dL — ABNORMAL LOW (ref 13.0–17.0)
POTASSIUM: 3 mmol/L — AB (ref 3.5–5.1)
Sodium: 146 mmol/L — ABNORMAL HIGH (ref 135–145)
TCO2: 19 mmol/L (ref 0–100)

## 2014-06-21 LAB — GLUCOSE, CAPILLARY
GLUCOSE-CAPILLARY: 83 mg/dL (ref 70–99)
GLUCOSE-CAPILLARY: 76 mg/dL (ref 70–99)
GLUCOSE-CAPILLARY: 78 mg/dL (ref 70–99)
Glucose-Capillary: 115 mg/dL — ABNORMAL HIGH (ref 70–99)
Glucose-Capillary: 86 mg/dL (ref 70–99)
Glucose-Capillary: 99 mg/dL (ref 70–99)

## 2014-06-21 MED ORDER — ACETAMINOPHEN 325 MG PO TABS: 650.0000 mg | ORAL_TABLET | ORAL | Status: DC | PRN

## 2014-06-21 MED ORDER — POTASSIUM CHLORIDE 20 MEQ/15ML (10%) PO SOLN
40.0000 meq | Freq: Once | ORAL | Status: AC
  Administered 2014-06-21: 40 meq
  Filled 2014-06-21: qty 30

## 2014-06-21 MED ORDER — SODIUM CHLORIDE 0.9 % IV SOLN
4.0000 g | Freq: Once | INTRAVENOUS | Status: AC
  Administered 2014-06-21: 4 g via INTRAVENOUS
  Filled 2014-06-21: qty 40

## 2014-06-21 MED ORDER — ACD FORMULA A 0.73-2.45-2.2 GM/100ML VI SOLN
Status: AC
  Administered 2014-06-21: 500 mL via INTRAVENOUS
  Filled 2014-06-21: qty 500

## 2014-06-21 MED ORDER — HEPARIN SODIUM (PORCINE) 1000 UNIT/ML IJ SOLN
1000.0000 [IU] | Freq: Once | INTRAMUSCULAR | Status: DC
Start: 2014-06-21 — End: 2014-06-22
  Filled 2014-06-21: qty 1

## 2014-06-21 MED ORDER — ACD FORMULA A 0.73-2.45-2.2 GM/100ML VI SOLN: 500.0000 mL | Status: DC

## 2014-06-21 MED ORDER — SODIUM CHLORIDE 0.9 % IV SOLN
INTRAVENOUS | Status: AC
  Administered 2014-06-21 (×3): via INTRAVENOUS_CENTRAL
  Filled 2014-06-21 (×3): qty 200

## 2014-06-21 MED ORDER — ASPIRIN EC 325 MG PO TBEC
325.0000 mg | DELAYED_RELEASE_TABLET | Freq: Every day | ORAL | Status: DC
  Administered 2014-06-21: 325 mg via ORAL
  Filled 2014-06-21 (×2): qty 1

## 2014-06-21 MED ORDER — CALCIUM CARBONATE ANTACID 500 MG PO CHEW: 2.0000 | CHEWABLE_TABLET | ORAL | Status: DC

## 2014-06-21 MED ORDER — CALCIUM CARBONATE ANTACID 500 MG PO CHEW
2.0000 | CHEWABLE_TABLET | ORAL | Status: AC
  Filled 2014-06-21 (×2): qty 2

## 2014-06-21 MED ORDER — DIPHENHYDRAMINE HCL 25 MG PO CAPS: 25.0000 mg | ORAL_CAPSULE | Freq: Four times a day (QID) | ORAL | Status: DC | PRN

## 2014-06-21 MED ORDER — ACD FORMULA A 0.73-2.45-2.2 GM/100ML VI SOLN
500.0000 mL | Status: DC
  Administered 2014-06-21 (×2): 500 mL via INTRAVENOUS
  Filled 2014-06-21: qty 500

## 2014-06-21 MED ORDER — BENAZEPRIL HCL 20 MG PO TABS
20.0000 mg | ORAL_TABLET | Freq: Every day | ORAL | Status: DC
  Administered 2014-06-21: 20 mg via ORAL
  Filled 2014-06-21 (×2): qty 1

## 2014-06-21 MED ORDER — HEPARIN SODIUM (PORCINE) 1000 UNIT/ML IJ SOLN: 1000.0000 [IU] | Freq: Once | INTRAMUSCULAR | Status: DC

## 2014-06-21 NOTE — Progress Notes (Signed)
NIF -60  VC 1.3 L  Both performed with good effort.

## 2014-06-21 NOTE — Progress Notes (Signed)
TPE ended w/ no complications. Pt alert, vss, no c/o, non productive cough. Willaim ShengMelissa H, primary RN aware and given report.

## 2014-06-21 NOTE — Progress Notes (Signed)
PULMONARY / CRITICAL CARE MEDICINE   Name: Salina AprilBilly W Wellen MRN: [id number] DOB: [birth year]    ADMISSION DATE:  06/03/2014 CONSULTATION DATE:  2/25  REFERRING MD :  Thad Rangereynolds   CHIEF COMPLAINT:  resp distress   INITIAL PRESENTATION: 81 y/o male with hx HTN, malnutrition.  Presented 2/18 with several months of progressive weakness, dysphagia, weight loss and neurologic workup concerning for myasthenia gravis. He was tx to CIR 2/24 and plasma pheresis initiated.  On 2/25 developed increased weakness, AFib with RVR and productive cough and concern for inability to protect airway.  He was tx to Neuro ICU and PCCM asked to admit.   STUDIES:  2/18 MRI brain>>> neg acute, mod generalized atrophy.  2/26 TTE >> LVEF 65-70%, mild MR, LAE (mild), PAS 40mmHg, moderate pericardial effusion without evidence of hemodynamic compromise  SIGNIFICANT EVENTS: 2/25  Tx ICU, PLEX 2/26  Continues to have issue with rate control, breathing improved 2/27  Improved respiratory status, converted to SR in 60's    SUBJECTIVE:  RN reports no acute events, pt able to self suction (has moist cough).    VITAL SIGNS: Temp:  [97 F (36.1 C)-97.9 F (36.6 C)] 97.5 F (36.4 C) (02/27 1157) Pulse Rate:  [52-84] 59 (02/27 1100) Resp:  [16-26] 16 (02/27 1100) BP: (114-200)/(48-150) 170/70 mmHg (02/27 1100) SpO2:  [90 %-100 %] 95 % (02/27 1100) Weight:  [138 lb 10.7 oz (62.9 kg)] 138 lb 10.7 oz (62.9 kg) (02/27 0500)   HEMODYNAMICS:     VENTILATOR SETTINGS:     INTAKE / OUTPUT:  Intake/Output Summary (Last 24 hours) at 06/21/14 1227 Last data filed at 06/21/14 1100  Gross per 24 hour  Intake 2509.1 ml  Output    765 ml  Net 1744.1 ml    PHYSICAL EXAMINATION: General:  Frail elderly male in NAD, up to chair HEENT: NCAT, Panda PULM: even/non-labored, few rhonchi bilaterally that clear with cough CV: Irreg irreg, systolic murmur LLSB to axilla AB: BS+, soft, nontender Ext: atrophy of musculature Neuro:  A&Ox3, can lift hand/arms  LABS:  CBC  Recent Labs Lab 06/09/2014 0537 06/10/2014 1225 05/27/2014 1510 06/20/14 0600 06/21/14 0858  WBC 7.2 7.4  --  10.9*  --   HGB 11.4* 12.2* 12.6* 11.5* 11.6*  HCT 35.2* 38.5* 37.0* 35.8* 34.0*  PLT 128* 129*  --  135*  --    Coag's No results for input(s): APTT, INR in the last 168 hours.   BMET  Recent Labs Lab 06/07/2014 0537  06/20/14 0600 06/21/14 0645 06/21/14 0858  NA 138  < > 138 138 146*  K 3.9  < > 3.6 3.6 3.0*  CL 107  < > 110 112 106  CO2 22  --  21 20  --   BUN 29*  < > 31* 41* 37*  CREATININE 1.30  < > 1.26 1.40* 1.40*  GLUCOSE 112*  < > 164* 135* 144*  < > = values in this interval not displayed.   Electrolytes  Recent Labs Lab 06/04/2014 0537 06/20/14 0600 06/21/14 0645  CALCIUM 9.0 8.7 8.4  MG  --  1.8  --   PHOS  --  3.7  --    Sepsis Markers No results for input(s): LATICACIDVEN, PROCALCITON, O2SATVEN in the last 168 hours.   ABG No results for input(s): PHART, PCO2ART, PO2ART in the last 168 hours.   Liver Enzymes  Recent Labs Lab 06/17/14 0924 05/29/2014 0537  AST 86* 82*  ALT 61* 56*  ALKPHOS 39 28*  BILITOT 0.9 1.0  ALBUMIN 2.1* 2.6*   Cardiac Enzymes No results for input(s): TROPONINI, PROBNP in the last 168 hours.   Glucose  Recent Labs Lab 06/20/14 1545 06/20/14 1936 06/20/14 2316 06/21/14 0317 06/21/14 0755 06/21/14 1145  GLUCAP 140* 119* 116* 115* 83 86    Imaging CXr 2/26 images reviewed> bibasilar atelectasis worse, bilateral effusions  ASSESSMENT / PLAN:  PULMONARY Acute respiratory failure with hypoxemia due to inability to clear secretions> improving Aspiration Pneumonitis vs atelectasis > improving Bilateral pleural effusions> volume overload? P:   Aggressive pulm hygiene  NT suction PRN  NPO  Continue to monitor respiratory status in ICU See neuro  Trend NIF / VC  CARDIOVASCULAR Hx HTN  AFib with RVR - new, suspect stress induced.  SR 2/27 Pericardial  effusion P:  Per Cardiology Amiodarone gtt   RENAL Hypokalemia  P:   Trend BMP Replace electrolytes as indicated, 40 KCL 2/27  GASTROINTESTINAL Protein calorie malnutrition  Weight loss  Dysphagia  P:   NPO, consider SLP in am 2/28 Panda for TF and meds Aspiration precautions  Dietary consult   HEMATOLOGIC Thrombocytopenia - mild, improving Anemia  P:  F/u cbc  Sq heparin   INFECTIOUS Aspiration without clear evidence of infection P:   Continue to monitor wbc and fever curve off abx   ENDOCRINE No active issue  P:   Monitor glucose on chem   NEUROLOGIC Myasthenia Gravis  P:   Neuro following; pt has received mestinon, IVIg with persistent bulbar sx PLEX #3/5, next planned for 2/29, then 3/2 NPO  Aggressive pulm hygiene as above    FAMILY  - Updates:  Grand-daughter updated at bedside.  - Inter-disciplinary family meeting or Palliative Care meeting due by:  3/2    Canary Brim, NP-C [facility] Pgr: [phone] or [phone]   Attending:  I have seen and examined the patient with nurse practitioner/resident and agree with the note above.   Overall much improved Afib is now rate controlled Ready for floor but still on amiodarone gtt, will f/u cardiology recs  Heber [place], MD [facility] Pager: [phone] Cell: [phone] If no response, call [phone]

## 2014-06-21 NOTE — Evaluation (Signed)
Physical Therapy Evaluation Patient Details Name: [name] MRN: [id number] DOB: 06-Jul-1928 Today's Date: 06/21/2014   History of Present Illness  81 y/o male with hx HTN, malnutrition. Presented 2/18 with several months of progressive weakness, dysphagia, weight loss and neurologic workup concerning for myasthenia gravis. He was tx to CIR 2/24 and plasma pheresis initiated. On 2/25 developed increased weakness, AFib with RVR and productive cough and concern for inability to protect airway. He was tx to Neuro ICU  Clinical Impression  Patient demonstrates deficits in functional mobility as indicated below. Will need continued skilled PT to address deficits and maximize function. Will see as indicated and progress as tolerated. Recommend return to CIR upon acute discharge. OF NOTE: VSS on room air with activity >94%    Follow Up Recommendations CIR    Equipment Recommendations  Rolling walker with 5" wheels    Recommendations for Other Services Rehab consult     Precautions / Restrictions Precautions Precautions: Fall Precaution Comments: patient with multiple lines  Restrictions Weight Bearing Restrictions: No      Mobility  Bed Mobility Overal bed mobility: Needs Assistance Bed Mobility: Rolling;Supine to Sit Rolling: Min assist   Supine to sit: Min assist     General bed mobility comments: assist to elevate and pull trunk to upright position  Transfers Overall transfer level: Needs assistance Equipment used: 2 person hand held assist Transfers: Sit to/from Stand Sit to Stand: Min assist         General transfer comment: increased time to elevate to standing, assist for stability  Ambulation/Gait Ambulation/Gait assistance: Min assist Ambulation Distance (Feet): 6 Feet Assistive device: 1 person hand held assist Gait Pattern/deviations: Step-to pattern;Decreased stride length;Narrow base of support Gait velocity: decreased Gait velocity interpretation:  Below normal speed for age/gender General Gait Details: VCs for upright posture  Stairs            Wheelchair Mobility    Modified Rankin (Stroke Patients Only)       Balance   Sitting-balance support: No upper extremity supported Sitting balance-Leahy Scale: Fair     Standing balance support: Bilateral upper extremity supported Standing balance-Leahy Scale: Poor Standing balance comment: assist for stability in static standing                             Pertinent Vitals/Pain Pain Assessment: 0-10 Pain Score: 4  Pain Location: right chest region Pain Descriptors / Indicators: Sore Pain Intervention(s): Limited activity within patient's tolerance;Monitored during session;Repositioned;Relaxation    Home Living Family/patient expects to be discharged to:: Inpatient rehab Living Arrangements: Alone Available Help at Discharge: Family;Available 24 hours/day (live across the street) Type of Home: House Home Access: Stairs to enter     Home Layout: One level;Other (Comment) (1 room is raised/sunk by 1 step) Home Equipment: None      Prior Function Level of Independence: Independent         Comments: prior to initial admission     Hand Dominance   Dominant Hand: Right    Extremity/Trunk Assessment               Lower Extremity Assessment: Generalized weakness;RLE deficits/detail;LLE deficits/detail RLE Deficits / Details: 3+/5 gross motions upon assessment LLE Deficits / Details: 3+/5 gross motions upon assessment  Cervical / Trunk Assessment: Kyphotic  Communication   Communication: No difficulties  Cognition Arousal/Alertness: Awake/alert Behavior During Therapy: Flat affect;WFL for tasks assessed/performed Overall Cognitive Status: Within Functional  Limits for tasks assessed       Memory: Decreased short-term memory              General Comments General comments (skin integrity, edema, etc.): various trunk control  activities performed EOB, tolerated well with occasional cues for support    Exercises        Assessment/Plan    PT Assessment Patient needs continued PT services  PT Diagnosis Abnormality of gait;Generalized weakness   PT Problem List Decreased strength;Decreased activity tolerance;Decreased balance;Decreased mobility;Decreased knowledge of use of DME;Decreased safety awareness  PT Treatment Interventions Gait training;DME instruction;Stair training;Functional mobility training;Therapeutic activities;Therapeutic exercise;Neuromuscular re-education;Patient/family education;Balance training   PT Goals (Current goals can be found in the Care Plan section) Acute Rehab PT Goals Patient Stated Goal: not stated PT Goal Formulation: With patient Time For Goal Achievement: 07/03/14 Potential to Achieve Goals: Good    Frequency Min 3X/week   Barriers to discharge        Co-evaluation               End of Session Equipment Utilized During Treatment: Gait belt Activity Tolerance: Patient tolerated treatment well;Patient limited by fatigue Patient left: in chair;with call bell/phone within reach;with family/visitor present Nurse Communication: Mobility status         Time: [id number] PT Time Calculation (min) (ACUTE ONLY): 24 min   Charges:   PT Evaluation $Initial PT Evaluation Tier I: 1 Procedure PT Treatments $Therapeutic Activity: 8-22 mins   PT G CodesFabio Asa:        Laurita Peron J 06/21/2014, 1:22 PM  Charlotte Crumbevon Zeeva Courser, PT DPT  [phone]

## 2014-06-21 NOTE — Progress Notes (Signed)
SUBJECTIVE:  The patient is sitting in a chair. He is weak and frail. Two-dimensional echo  showed normal ejection fraction is 65%. The echo also shows a pericardial effusion that is moderate. However he does not have any significant hemodynamic effect.  Denies SOB, palpitations or CP.   Scheduled Meds: . antiseptic oral rinse  7 mL Mouth Rinse q12n4p  . calcium carbonate  2 tablet Oral Q3H  . chlorhexidine  15 mL Mouth Rinse BID  . famotidine  20 mg Per Tube Daily  . heparin  1,000 Units Intracatheter Once  . heparin  5,000 Units Subcutaneous 3 times per day  . polyethylene glycol  17 g Oral Daily  . pyridostigmine  90 mg Per Tube TID   Continuous Infusions: . amiodarone 30 mg/hr (06/21/14 0700)  . citrate dextrose    . diltiazem (CARDIZEM) infusion Stopped (06/20/14 1045)  . feeding supplement (VITAL AF 1.2 CAL) 1,000 mL (06/21/14 0600)   PRN Meds:.sodium chloride, acetaminophen (TYLENOL) oral liquid 160 mg/5 mL, diphenhydrAMINE, levalbuterol   Filed Vitals:   06/21/14 1200 06/21/14 1300 06/21/14 1312 06/21/14 1320  BP: 156/74  168/67   Pulse:  78  72  Temp:      TempSrc:      Resp:  17  24  Height:      Weight:      SpO2:  99%  98%     Intake/Output Summary (Last 24 hours) at 06/21/14 1412 Last data filed at 06/21/14 1400  Gross per 24 hour  Intake 2610.8 ml  Output    590 ml  Net 2020.8 ml    LABS: Basic Metabolic Panel:  Recent Labs  16/10/96 0600 06/21/14 0645 06/21/14 0858  NA 138 138 146*  K 3.6 3.6 3.0*  CL 110 112 106  CO2 21 20  --   GLUCOSE 164* 135* 144*  BUN 31* 41* 37*  CREATININE 1.26 1.40* 1.40*  CALCIUM 8.7 8.4  --   MG 1.8  --   --   PHOS 3.7  --   --    Liver Function Tests:  Recent Labs  23-Jun-2014 0537  AST 82*  ALT 56*  ALKPHOS 28*  BILITOT 1.0  PROT 5.8*  ALBUMIN 2.6*   No results for input(s): LIPASE, AMYLASE in the last 72 hours. CBC:  Recent Labs  23-Jun-2014 0537 2014-06-23 1225  06/20/14 0600 06/21/14 0858    WBC 7.2 7.4  --  10.9*  --   NEUTROABS 4.8  --   --   --   --   HGB 11.4* 12.2*  < > 11.5* 11.6*  HCT 35.2* 38.5*  < > 35.8* 34.0*  MCV 93.4 92.8  --  91.3  --   PLT 128* 129*  --  135*  --   < > = values in this interval not displayed. Cardiac Enzymes: No results for input(s): CKTOTAL, CKMB, CKMBINDEX, TROPONINI in the last 72 hours. BNP: Invalid input(s): POCBNP D-Dimer: No results for input(s): DDIMER in the last 72 hours. Hemoglobin A1C: No results for input(s): HGBA1C in the last 72 hours. Fasting Lipid Panel: No results for input(s): CHOL, HDL, LDLCALC, TRIG, CHOLHDL, LDLDIRECT in the last 72 hours. Thyroid Function Tests: No results for input(s): TSH, T4TOTAL, T3FREE, THYROIDAB in the last 72 hours.  Invalid input(s): FREET3  RADIOLOGY: Mr Brain Wo Contrast  06/13/2014   CLINICAL DATA:  Progressive generalized weakness with diplopia, dysphagia, and bilateral ptosis. Suspected myasthenia gravis. Initial evaluation.  EXAM:  MRI HEAD WITHOUT CONTRAST  TECHNIQUE: Multiplanar, multiecho pulse sequences of the brain and surrounding structures were obtained without intravenous contrast.  COMPARISON:  None.  FINDINGS: Diffuse prominence of the CSF containing spaces is compatible with generalized cerebral atrophy. No significant white matter changes present. No chronic infarction.  No abnormal foci of restricted diffusion to suggest acute intracranial infarct. Gray-white matter differentiation maintained. Normal intravascular flow voids present. No acute or chronic intracranial hemorrhage.  No mass lesion or midline shift. No hydrocephalus. No extra-axial fluid collection.  Craniocervical junction normal. Pituitary gland within normal limits. No acute abnormality seen about the orbits.  Paranasal sinuses and mastoid air cells are clear.  Mild degenerative changes noted within the upper cervical spine. Bone marrow signal intensity normal. Scalp soft tissues within normal limits.  IMPRESSION: 1.  No acute intracranial abnormality. 2. Moderate generalized cerebral atrophy.   Electronically Signed   By: Rise Mu M.D.   On: 06/13/2014 00:49   Dg Chest Port 1 View  06/20/2014   CLINICAL DATA:  Respiratory failure  EXAM: PORTABLE CHEST - 1 VIEW  COMPARISON:  June 19, 2014  FINDINGS: Central catheter tip is in the superior vena cava. Feeding tube tip is in the stomach. No pneumothorax. There is persistent consolidation in the left lower lobe with clearing left effusion. There is a small right effusion as well. Lungs elsewhere clear. Heart is enlarged. There appears to be a degree of pulmonary venous hypertension. No adenopathy.  IMPRESSION: Tube and catheter positions as described without pneumothorax. Persistent left lower lobe consolidation. There are bilateral pleural effusions, larger on the left than on the right. Stable cardiomegaly with a degree of pulmonary venous hypertension. There is felt to be a degree of underlying congestive heart failure.   Electronically Signed   By: Bretta Bang III M.D.   On: 06/20/2014 07:40   Dg Chest Port 1 View  06/18/2014   CLINICAL DATA:  Respiratory failure, shortness of breath, congestion  EXAM: PORTABLE CHEST - 1 VIEW  COMPARISON:  06/16/2014  FINDINGS: Cardiomegaly again noted. Central mild vascular congestion without pulmonary edema. Left IJ catheter is unchanged is position. Stable small left pleural effusion with left basilar atelectasis or infiltrate.  IMPRESSION: No significant change. Central mild vascular congestion without pulmonary edema. Small left pleural effusion with left basilar atelectasis or infiltrate.   Electronically Signed   By: Natasha Mead M.D.   On: 05/29/2014 10:23   Dg Chest Port 1 View  06/16/2014   CLINICAL DATA:  81 year old male status post central line placement, positioning. Initial encounter.  EXAM: PORTABLE CHEST - 1 VIEW  COMPARISON:  06/12/2014 and earlier.  FINDINGS: Portable AP upright view at 1535 hours.  Left IJ approach central line now in place. Tip projects at the cavoatrial junction level. Lower lung volumes. Stable cardiac size and mediastinal contours. No pneumothorax. Increased interstitial opacity, in part felt due to crowding. Increased retrocardiac opacity, Suggestion of small left pleural effusion.  IMPRESSION: 1. Left IJ approach central line placed, tip at the cavoatrial junction level. 2. Lower lung volumes with increased crowding of markings and possible vascular congestion. 3. Increased lung base opacity suspected due to left pleural effusion.   Electronically Signed   By: Odessa Fleming M.D.   On: 06/16/2014 15:54   Dg Chest Port 1 View  06/12/2014   CLINICAL DATA:  Cough. Questionable aspiration. Generalized weakness.  EXAM: PORTABLE CHEST - 1 VIEW  COMPARISON:  Chest CT, 12/17/2013.  FINDINGS: The heart size  and mediastinal contours are within normal limits. Both lungs are clear. The visualized skeletal structures are unremarkable.  IMPRESSION: No active disease.   Electronically Signed   By: Amie Portland M.D.   On: 06/12/2014 16:01   Dg Abd Portable 1v  06/01/2014   CLINICAL DATA:  Feeding tube placement  EXAM: PORTABLE ABDOMEN - 1 VIEW  COMPARISON:  None.  FINDINGS: The bowel gas pattern is normal. These NG feeding tube with tip in proximal stomach. Residual contrast material noted within colon. Distal colonic diverticula are noted.  IMPRESSION: NG feeding tube with tip in proximal stomach.   Electronically Signed   By: Natasha Mead M.D.   On: 06/14/2014 12:17   Dg Swallowing Func-speech Pathology  06/13/2014    Objective Swallowing Evaluation:    Patient Details  Name: Ronald DEMARTIN MRN: [id number] Date of Birth: 1928/07/03  Today's Date: 06/13/2014 Time: SLP Start Time (ACUTE ONLY): 0815-SLP Stop Time (ACUTE ONLY): 0850 SLP Time Calculation (min) (ACUTE ONLY): 35 min  Past Medical History:  Past Medical History  Diagnosis Date  . Hypertension   . Carotid artery disease   . Weight loss,  unintentional   . Protein-calorie malnutrition, moderate    Past Surgical History:  Past Surgical History  Procedure Laterality Date  . Hernia repair      Bilateral  . Prostate biopsy      WNL  . Hemorrhoid surgery    . Cataract extraction     HPI:  HPI: [name] is a 81 y.o. male with a past medical history of  hypertension, gastroesophageal reflux disease, presenting as a transfer  from his neurologist office. Patient having progressive generalized  weakness that started 6 months to 1 year ago, that has been associated  with diplopia, dysphasia, and bilateral ptosis. Patient states that  symptoms occurring throughout the day however on occasion have been worse  at nighttime. He reports having multiple falls in in the past with his  last fall being one month ago. He also reports a 20 pound weight loss  throughout this period, attributing this to minimal by mouth intake  stating he cannot have solid foods. He was evaluated by his neurologist on  day of admit felt that symptoms likely secondary to myasthenia gravis.   MRI brain and CXR negative.    No Data Recorded  Assessment / Plan / Recommendation CHL IP CLINICAL IMPRESSIONS 06/13/2014  Dysphagia Diagnosis Moderate oral phase dysphagia;Severe pharyngeal phase  dysphagia;Severe cervical esophageal phase dysphagia  Clinical impression Moderate oral and severe pharyngo-cervical esophageal  dysphagia with significant weak muscular contraction.  Said weakness  results in severe pharyngeal stasis across all consistencies that mix with  secretions retained.  Liquid swallows faciliated clearance but resulted in  mild aspiration of thin with sensation.  Pt is able to expectorate to  clear vallecular residuals he cannot clear into esophagus.  Pt  expectorated viscous secretions throughout entire evaluation-retained  secretions he is unable to swallow.   Pt is a high aspiration risk with  all intake but apparently has been tolerating given his negative CXR.  He  does  report issues with excessive secretions for the last few months.  MD  may desire to allow pt to continue po intake.  Concern for pt to be able  to support self nutritionally at this time due to level of dysphagia.        CHL IP TREATMENT RECOMMENDATION 06/13/2014  Treatment Plan Recommendations Therapy as outlined in treatment  plan below      CHL IP DIET RECOMMENDATION 06/13/2014  Diet Recommendations Ice chips PRN after oral care;Dysphagia 2 (Fine  chop);Thin liquid  Liquid Administration via Cup;Straw  Medication Administration Crushed with puree  Compensations Slow rate;Small sips/bites;Multiple dry swallows after each  bite/sip;Follow solids with liquid;Effortful swallow;Hard cough after  swallow  Postural Changes and/or Swallow Maneuvers Seated upright 90  degrees;Upright 30-60 min after meal     CHL IP OTHER RECOMMENDATIONS 06/13/2014  Recommended Consults (None)  Oral Care Recommendations Oral care BID  Other Recommendations (None)     CHL IP FOLLOW UP RECOMMENDATIONS 06/13/2014  Follow up Recommendations None     CHL IP FREQUENCY AND DURATION 06/13/2014  Speech Therapy Frequency (ACUTE ONLY) (None)  Treatment Duration 2 weeks      CHL IP REASON FOR REFERRAL 06/13/2014  Reason for Referral Objectively evaluate swallowing function     CHL IP ORAL PHASE 06/13/2014  Lips (None)  Tongue (None)  Mucous membranes (None)  Nutritional status (None)  Other (None)  Oxygen therapy (None)  Oral Phase Impaired  Oral - Pudding Teaspoon (None)  Oral - Pudding Cup (None)  Oral - Honey Teaspoon (None)  Oral - Honey Cup (None)  Oral - Honey Syringe (None)  Oral - Nectar Teaspoon (None)  Oral - Nectar Cup Weak lingual manipulation;Delayed oral transit;Reduced  posterior propulsion;Piecemeal swallowing  Oral - Nectar Straw (None)  Oral - Nectar Syringe (None)  Oral - Ice Chips (None)  Oral - Thin Teaspoon Weak lingual manipulation;Delayed oral  transit;Reduced posterior propulsion;Piecemeal swallowing  Oral - Thin Cup Weak lingual  manipulation;Delayed oral transit;Reduced  posterior propulsion;Piecemeal swallowing  Oral - Thin Straw (None)  Oral - Thin Syringe (None)  Oral - Puree Weak lingual manipulation;Delayed oral transit;Reduced  posterior propulsion  Oral - Mechanical Soft Weak lingual manipulation;Delayed oral  transit;Reduced posterior propulsion  Oral - Regular (None)  Oral - Multi-consistency (None)  Oral - Pill (None)  Oral Phase - Comment (None)      CHL IP PHARYNGEAL PHASE 06/13/2014  Pharyngeal Phase Impaired  Pharyngeal - Pudding Teaspoon (None)  Penetration/Aspiration details (pudding teaspoon) (None)  Pharyngeal - Pudding Cup (None)  Penetration/Aspiration details (pudding cup) (None)  Pharyngeal - Honey Teaspoon (None)  Penetration/Aspiration details (honey teaspoon) (None)  Pharyngeal - Honey Cup (None)  Penetration/Aspiration details (honey cup) (None)  Pharyngeal - Honey Syringe (None)  Penetration/Aspiration details (honey syringe) (None)  Pharyngeal - Nectar Teaspoon (None)  Penetration/Aspiration details (nectar teaspoon) (None)  Pharyngeal - Nectar Cup Reduced pharyngeal peristalsis;Reduced epiglottic  inversion;Reduced tongue base retraction;Reduced airway/laryngeal  closure;Reduced laryngeal elevation;Pharyngeal residue -  valleculae;Pharyngeal residue - pyriform sinuses;Reduced anterior  laryngeal mobility  Penetration/Aspiration details (nectar cup) (None)  Pharyngeal - Nectar Straw (None)  Penetration/Aspiration details (nectar straw) (None)  Pharyngeal - Nectar Syringe (None)  Penetration/Aspiration details (nectar syringe) (None)  Pharyngeal - Ice Chips (None)  Penetration/Aspiration details (ice chips) (None)  Pharyngeal - Thin Teaspoon Reduced pharyngeal peristalsis;Reduced  epiglottic inversion;Reduced tongue base retraction;Reduced  airway/laryngeal closure;Reduced laryngeal  elevation;Penetration/Aspiration during swallow;Pharyngeal residue -  valleculae;Pharyngeal residue - pyriform sinuses;Reduced  anterior  laryngeal mobility  Penetration/Aspiration details (thin teaspoon) Material enters airway,  remains ABOVE vocal cords and not ejected out  Pharyngeal - Thin Cup Reduced pharyngeal peristalsis;Reduced epiglottic  inversion;Pharyngeal residue - valleculae;Pharyngeal residue - pyriform  sinuses;Trace aspiration;Penetration/Aspiration during  swallow;Penetration/Aspiration after swallow;Reduced anterior laryngeal  mobility  Penetration/Aspiration details (thin cup) Material enters airway, passes  BELOW cords and not ejected out despite cough attempt by  patient  Pharyngeal - Thin Straw (None)  Penetration/Aspiration details (thin straw) (None)  Pharyngeal - Thin Syringe (None)  Penetration/Aspiration details (thin syringe') (None)  Pharyngeal - Puree Reduced pharyngeal peristalsis;Reduced epiglottic  inversion;Pharyngeal residue - valleculae;Reduced tongue base  retraction;Reduced airway/laryngeal closure;Reduced laryngeal  elevation;Reduced anterior laryngeal mobility  Penetration/Aspiration details (puree) (None)  Pharyngeal - Mechanical Soft Reduced pharyngeal peristalsis;Reduced  epiglottic inversion;Pharyngeal residue - valleculae;Reduced laryngeal  elevation;Reduced airway/laryngeal closure;Reduced tongue base  retraction;Reduced anterior laryngeal mobility  Penetration/Aspiration details (mechanical soft) (None)  Pharyngeal - Regular (None)  Penetration/Aspiration details (regular) (None)  Pharyngeal - Multi-consistency (None)  Penetration/Aspiration details (multi-consistency) (None)  Pharyngeal - Pill (None)  Penetration/Aspiration details (pill) (None)  Pharyngeal Comment pharyngeal residuals *(mixing with viscous secretions)  across consistencies noted with pt awareness - liquid swallows faciliated  clearance but resulted in mild aspiration, pt is able to expectorate to  clear vallecular residuals he does not swallow     CHL IP CERVICAL ESOPHAGEAL PHASE 06/13/2014  Cervical Esophageal Phase Impaired                     Nectar Cup Reduced cricopharyngeal relaxation        Thin Teaspoon Reduced cricopharyngeal relaxation                                                                                                          Mills Koller, MS [facility] SLP [phone]     Physical exam General:  Frail elderly sitting in chai HEENT: normal Neck: supple. no JVD. Carotids 2+ bilat; no bruits. No lymphadenopathy or thryomegaly appreciated. Cor: PMI nondisplaced. Regular rate & rhythm. 2/6 SEM Lungs: minimal crackles Abdomen: soft, nontender, nondistended. No hepatosplenomegaly. No bruits or masses. Good bowel sounds. Extremities: no cyanosis, clubbing, rash, edema Neuro: alert. No obvious focal deficits     TELEMETRY: NSR 70s   ASSESSMENT AND PLAN:    Atrial fibrillation with RVR (CHADSvasc = 4)    --back in NSR. Will continue IV amio one more day and switch to 200 po bid tomorrow.    --not good candidate for long-term AC. Will use ASA 325    Acute respiratory failure in setting of myasthenia gravis     --improved    Pericardial effusion    His echo showed normal left ventricular function. There was a moderate pericardial effusion. There is no hemodynamic effect. This can be followed over time with repeat echo in 6-8 weeks.    Reuel Boom Bensimhon 06/21/2014 2:12 PM

## 2014-06-21 NOTE — Progress Notes (Signed)
eLink Physician-Brief Progress Note Patient Name: Salina AprilBilly W Salaam DOB: 01-21-1929 MRN: [id number]   Date of Service  06/21/2014  HPI/Events of Note   Patient hypertensive.   eICU Interventions   Restarting home benazepril.      Intervention Category Intermediate Interventions: Hypertension - evaluation and management  Algenis Ballin R. 06/21/2014, 4:16 PM

## 2014-06-21 NOTE — Progress Notes (Signed)
Pt had good effort.  Sitting in chair.  NIF -60  VC 1.35l

## 2014-06-21 NOTE — Progress Notes (Signed)
Subjective: Patient awake and alert.   Has some shoulder pain that he reports is from the frequent transfers but is otherwise doing well.    Objective: Current vital signs: BP 152/66 mmHg  Pulse 66  Temp(Src) 97.9 F (36.6 C) (Axillary)  Resp 24  Ht  (1.753 m)  Wt 62.9 kg (138 lb 10.7 oz)  BMI 20.47 kg/m2  SpO2 96% Vital signs in last 24 hours: Temp:  [97 F (36.1 C)-97.9 F (36.6 C)] 97.9 F (36.6 C) (02/27 0355) Pulse Rate:  [54-84] 66 (02/27 0700) Resp:  [18-26] 24 (02/27 0700) BP: (107-162)/(48-81) 152/66 mmHg (02/27 0700) SpO2:  [90 %-100 %] 96 % (02/27 0700) Weight:  [62.9 kg (138 lb 10.7 oz)] 62.9 kg (138 lb 10.7 oz) (02/27 0500)  Intake/Output from previous day: 02/26 0701 - 02/27 0700 In: 1532.1 [I.V.:452.1; NG/GT:1080] Out: 815 [Urine:795] Intake/Output this shift:   Nutritional status: Diet NPO time specified  Neurologic Exam: Mental Status: Alert, oriented, thought content appropriate. Speech fluent without evidence of aphasia. Able to follow 3 step commands without difficulty. Cranial Nerves: II: Visual fields grossly normal, pupils equal, round, reactive to light and accommodation III,IV, VI: ptosis present on the left, extra-ocular motions intact bilaterally V,VII: smile symmetric, facial light touch sensation normal bilaterally VIII: hearing normal bilaterally IX,X: gag reflex present, weak cough but stronger than on yesterday XI: bilateral shoulder shrug XII: midline tongue extension without atrophy or fasciculations Motor: Able to lift both arms above his head and gives 4-/5 strength proximally in the UE's.  5-/5 in the lower extremities.   Sensory: Pinprick and light touch intact throughout, bilaterally Deep Tendon Reflexes:  1+ throughout with no AJ Plantars: Right: downgoingLeft: downgoing   Lab Results: Basic Metabolic Panel:  Recent Labs Lab 06/17/14 0924 06/17/14 1503  06/11/2014 0537  06/15/2014 1225 06/11/2014 1510 06/20/14 0600 06/21/14 0645  NA 132* 136  --  138  --  142 138 138  K 4.4 4.3  --  3.9  --  3.8 3.6 3.6  CL 103 99  --  107  --  106 110 112  CO2 26  --   --  22  --   --  21 20  GLUCOSE 123* 100*  --  112*  --  116* 164* 135*  BUN 27* 29*  --  29*  --  29* 31* 41*  CREATININE 1.31 1.40*  < > 1.30 1.31 1.30 1.26 1.40*  CALCIUM 8.9  --   --  9.0  --   --  8.7 8.4  MG  --   --   --   --   --   --  1.8  --   PHOS  --   --   --   --   --   --  3.7  --   < > = values in this interval not displayed.  Liver Function Tests:  Recent Labs Lab 06/17/14 0924 06/17/2014 0537  AST 86* 82*  ALT 61* 56*  ALKPHOS 39 28*  BILITOT 0.9 1.0  PROT 6.8 5.8*  ALBUMIN 2.1* 2.6*   No results for input(s): LIPASE, AMYLASE in the last 168 hours. No results for input(s): AMMONIA in the last 168 hours.  CBC:  Recent Labs Lab 06/17/14 1630 06/18/14 1800 05/30/2014 0537 06/17/2014 1225 06/04/2014 1510 06/20/14 0600  WBC 4.8 8.1 7.2 7.4  --  10.9*  NEUTROABS  --   --  4.8  --   --   --  HGB 12.1* 12.1* 11.4* 12.2* 12.6* 11.5*  HCT 37.2* 37.2* 35.2* 38.5* 37.0* 35.8*  MCV 91.6 93.0 93.4 92.8  --  91.3  PLT 155 133* 128* 129*  --  135*    Cardiac Enzymes: No results for input(s): CKTOTAL, CKMB, CKMBINDEX, TROPONINI in the last 168 hours.  Lipid Panel: No results for input(s): CHOL, TRIG, HDL, CHOLHDL, VLDL, LDLCALC in the last 168 hours.  CBG:  Recent Labs Lab 06/20/14 1145 06/20/14 1545 06/20/14 1936 06/20/14 2316 06/21/14 0317  GLUCAP 121* 140* 119* 116* 115*    Microbiology: No results found for this or any previous visit.  Coagulation Studies: No results for input(s): LABPROT, INR in the last 72 hours.  Imaging: Dg Chest Port 1 View  06/20/2014   CLINICAL DATA:  Respiratory failure  EXAM: PORTABLE CHEST - 1 VIEW  COMPARISON:  07-08-14  FINDINGS: Central catheter tip is in the superior vena cava. Feeding tube tip is in the stomach. No  pneumothorax. There is persistent consolidation in the left lower lobe with clearing left effusion. There is a small right effusion as well. Lungs elsewhere clear. Heart is enlarged. There appears to be a degree of pulmonary venous hypertension. No adenopathy.  IMPRESSION: Tube and catheter positions as described without pneumothorax. Persistent left lower lobe consolidation. There are bilateral pleural effusions, larger on the left than on the right. Stable cardiomegaly with a degree of pulmonary venous hypertension. There is felt to be a degree of underlying congestive heart failure.   Electronically Signed   By: Bretta Bang III M.D.   On: 06/20/2014 07:40   Dg Chest Port 1 View  2014-07-08   CLINICAL DATA:  Respiratory failure, shortness of breath, congestion  EXAM: PORTABLE CHEST - 1 VIEW  COMPARISON:  06/16/2014  FINDINGS: Cardiomegaly again noted. Central mild vascular congestion without pulmonary edema. Left IJ catheter is unchanged is position. Stable small left pleural effusion with left basilar atelectasis or infiltrate.  IMPRESSION: No significant change. Central mild vascular congestion without pulmonary edema. Small left pleural effusion with left basilar atelectasis or infiltrate.   Electronically Signed   By: Natasha Mead M.D.   On: 07-08-2014 10:23   Dg Abd Portable 1v  08-Jul-2014   CLINICAL DATA:  Feeding tube placement  EXAM: PORTABLE ABDOMEN - 1 VIEW  COMPARISON:  None.  FINDINGS: The bowel gas pattern is normal. These NG feeding tube with tip in proximal stomach. Residual contrast material noted within colon. Distal colonic diverticula are noted.  IMPRESSION: NG feeding tube with tip in proximal stomach.   Electronically Signed   By: Natasha Mead M.D.   On: July 08, 2014 12:17    Medications:  I have reviewed the patient's current medications. Scheduled: . therapeutic plasma exchange solution   Dialysis Q1 Hr x 3  . antiseptic oral rinse  7 mL Mouth Rinse q12n4p  . calcium gluconate  IVPB  4 g Intravenous Once  . chlorhexidine  15 mL Mouth Rinse BID  . famotidine  20 mg Per Tube Daily  . heparin  5,000 Units Subcutaneous 3 times per day  . polyethylene glycol  17 g Oral Daily  . pyridostigmine  90 mg Per Tube TID    Assessment/Plan: Patient with some improvement noted in strength this morning.  Afebrile.  CCM following chest xray and ?aspiration PNA.  Remains on Mestinon.  NIF -60, VC 1.6L.  Today is day 3/5 TPE treatments.  Next treatment scheduled for 06/23/14.  Recommendations: 1.  Continue  TPE as scheduled.     LOS: 2 days   Thana FarrLeslie Deleon Passe, MD [facility] [phone] 06/21/2014  8:37 AM

## 2014-06-22 ENCOUNTER — Inpatient Hospital Stay (HOSPITAL_COMMUNITY): Payer: Medicare Other

## 2014-06-22 DIAGNOSIS — I469 Cardiac arrest, cause unspecified: Secondary | ICD-10-CM

## 2014-06-22 LAB — BLOOD GAS, ARTERIAL
Acid-base deficit: 9.6 mmol/L — ABNORMAL HIGH (ref 0.0–2.0)
BICARBONATE: 17.2 meq/L — AB (ref 20.0–24.0)
DRAWN BY: 41308
FIO2: 100 %
LHR: 16 {breaths}/min
MECHVT: 450 mL
O2 SAT: 63.2 %
PATIENT TEMPERATURE: 98.6
PEEP: 10 cmH2O
PH ART: 7.186 — AB (ref 7.350–7.450)
PO2 ART: 42.1 mmHg — AB (ref 80.0–100.0)
TCO2: 18.6 mmol/L (ref 0–100)
pCO2 arterial: 47.3 mmHg — ABNORMAL HIGH (ref 35.0–45.0)

## 2014-06-22 LAB — COMPREHENSIVE METABOLIC PANEL
ALBUMIN: 2.4 g/dL — AB (ref 3.5–5.2)
ALK PHOS: 30 U/L — AB (ref 39–117)
ALT: 39 U/L (ref 0–53)
ANION GAP: 13 (ref 5–15)
AST: 61 U/L — ABNORMAL HIGH (ref 0–37)
BILIRUBIN TOTAL: 0.2 mg/dL — AB (ref 0.3–1.2)
BUN: 36 mg/dL — AB (ref 6–23)
CHLORIDE: 117 mmol/L — AB (ref 96–112)
CO2: 16 mmol/L — ABNORMAL LOW (ref 19–32)
Calcium: 10.1 mg/dL (ref 8.4–10.5)
Creatinine, Ser: 1.46 mg/dL — ABNORMAL HIGH (ref 0.50–1.35)
GFR calc Af Amer: 49 mL/min — ABNORMAL LOW (ref >90)
GFR calc non Af Amer: 43 mL/min — ABNORMAL LOW (ref >90)
GLUCOSE: 318 mg/dL — AB (ref 70–99)
Potassium: 3.9 mmol/L (ref 3.5–5.1)
Sodium: 146 mmol/L — ABNORMAL HIGH (ref 135–145)
Total Protein: 3.9 g/dL — ABNORMAL LOW (ref 6.0–8.3)

## 2014-06-22 LAB — CBC
HCT: 33.1 % — ABNORMAL LOW (ref 39.0–52.0)
Hemoglobin: 10.8 g/dL — ABNORMAL LOW (ref 13.0–17.0)
MCH: 30.7 pg (ref 26.0–34.0)
MCHC: 32.6 g/dL (ref 30.0–36.0)
MCV: 94 fL (ref 78.0–100.0)
Platelets: 125 10*3/uL — ABNORMAL LOW (ref 150–400)
RBC: 3.52 MIL/uL — ABNORMAL LOW (ref 4.22–5.81)
RDW: 17.7 % — ABNORMAL HIGH (ref 11.5–15.5)
WBC: 16 10*3/uL — ABNORMAL HIGH (ref 4.0–10.5)

## 2014-06-22 LAB — PROTIME-INR
INR: 1.94 — ABNORMAL HIGH (ref 0.00–1.49)
Prothrombin Time: 22.3 s — ABNORMAL HIGH (ref 11.6–15.2)

## 2014-06-22 LAB — TRIGLYCERIDES: Triglycerides: 82 mg/dL

## 2014-06-22 LAB — GLUCOSE, CAPILLARY: Glucose-Capillary: 92 mg/dL (ref 70–99)

## 2014-06-22 LAB — TROPONIN I: Troponin I: 0.17 ng/mL — ABNORMAL HIGH (ref <0.031)

## 2014-06-22 MED ORDER — FUROSEMIDE 8 MG/ML PO SOLN
40.0000 mg | Freq: Once | ORAL | Status: AC
  Filled 2014-06-22: qty 5

## 2014-06-22 MED ORDER — SODIUM CHLORIDE 0.9 % IV SOLN
3.0000 g | Freq: Three times a day (TID) | INTRAVENOUS | Status: DC
  Filled 2014-06-22 (×2): qty 3

## 2014-06-22 MED ORDER — FENTANYL CITRATE 0.05 MG/ML IJ SOLN
INTRAMUSCULAR | Status: AC
  Filled 2014-06-22: qty 2

## 2014-06-22 MED ORDER — LABETALOL HCL 5 MG/ML IV SOLN
10.0000 mg | INTRAVENOUS | Status: DC | PRN
  Administered 2014-06-22: 10 mg via INTRAVENOUS
  Filled 2014-06-22: qty 4

## 2014-06-22 MED ORDER — HALOPERIDOL LACTATE 5 MG/ML IJ SOLN
5.0000 mg | Freq: Once | INTRAMUSCULAR | Status: AC
  Administered 2014-06-22: 5 mg via INTRAVENOUS

## 2014-06-22 MED ORDER — PROPOFOL 10 MG/ML IV EMUL
0.0000 ug/kg/min | INTRAVENOUS | Status: DC
  Administered 2014-06-22: 10 ug/kg/min via INTRAVENOUS
  Filled 2014-06-22: qty 100

## 2014-06-22 MED ORDER — FUROSEMIDE 10 MG/ML IJ SOLN
INTRAMUSCULAR | Status: AC
  Administered 2014-06-22: 40 mg
  Filled 2014-06-22: qty 4

## 2014-06-22 MED ORDER — HALOPERIDOL LACTATE 5 MG/ML IJ SOLN
INTRAMUSCULAR | Status: AC
  Filled 2014-06-22: qty 1

## 2014-06-22 MED ORDER — FENTANYL CITRATE 0.05 MG/ML IJ SOLN
50.0000 ug | Freq: Once | INTRAMUSCULAR | Status: AC
  Administered 2014-06-22: 50 ug via INTRAVENOUS

## 2014-06-22 MED ORDER — FENTANYL CITRATE 0.05 MG/ML IJ SOLN: 50.0000 ug | INTRAMUSCULAR | Status: DC | PRN

## 2014-06-23 NOTE — Accreditation Note (Signed)
o Restraints reported to CMS  Pursuant to regulation 482.13 (G) (3) use of restraints was logged and CMS was notified via email on 02.29.2016 at 0815 by Rosine DoorAngus Aleksey Newbern, RN, Patient Insurance account managerafety and Accreditation Coordinator.

## 2014-06-23 NOTE — IPOC Note (Signed)
IPOC not completed due to patient medical complications and transfer back to acute

## 2014-06-24 MED FILL — Medication: Qty: 1 | Status: AC

## 2014-06-24 NOTE — Progress Notes (Signed)
eLink Physician-Brief Progress Note Patient Name: [name] DOB: 02/27/1929 MRN: [id number]   Date of Service  06/18/2014  HPI/Events of Note  Hypertension on oral meds  eICU Interventions  Plan: PRN labetalol 10 mg IV q2 hours prn systolic BP greater than 170 mmHg     Intervention Category Intermediate Interventions: Hypertension - evaluation and management  DETERDING,ELIZABETH 06/14/2014, 12:51 AM

## 2014-06-24 NOTE — Progress Notes (Signed)
Called to assess pt for increased work of breathing, upon arrival, pt with coarse crackles and noticeably less responsive than when I saw him earlier in the shift, pt began to lose color and heart rate started to drop. RN called code and ACLS was initiated.

## 2014-06-24 NOTE — Progress Notes (Signed)
RN and Resp. Therapist at the bedside around 2:55 am when patient had increased agitation and work of breathing. Patient went bradycardic and then asytole. Chest compressions started immediately and code blue called. See code blue sheet. Patient on ventilator and went bradycardic again. Family in the waiting area and had made patient a DNR status. Family was brought to the room and patient expired at 4:44 am. Pronounced by Dr. Eliberto IvoryAustin. CDS notified.

## 2014-06-24 NOTE — Progress Notes (Signed)
Posey restraint ordered from [employer]. Received and applied at 2am. Bilateral mitts applied also. Patient remains confused but less combative.

## 2014-06-24 NOTE — Code Documentation (Addendum)
Patient Name: [name]   MRN: [id number]   Date of Birth/ Sex: Dec 23, 1928 , male      Admission Date: 06/13/2014  Attending Provider: Collene Gobble, MD  Primary Diagnosis: <principal problem not specified>   Indication: Pt was in his usual state of health until this AM, when he was noted to be bradycardic. Code blue was subsequently called. At the time of arrival on scene, ACLS protocol was underway.   Technical Description:  - CPR performance duration:  15 minutes  - Was defibrillation or cardioversion used? No   - Was external pacer placed? No  - Was patient intubated pre/post CPR? Yes   Medications Administered: Y = Yes; Blank = No Amiodarone    Atropine    Calcium  1  Epinephrine  3  Lidocaine    Magnesium  2  Norepinephrine    Phenylephrine    Sodium bicarbonate  1  Vasopressin     Post CPR evaluation:  - Final Status - Was patient successfully resuscitated ? Yes - What is current rhythm? Briefly tachycardic, then atrial fibrillation  - What is current hemodynamic status? 157/53  Miscellaneous Information:  - Labs sent, including: Troponin, CBC, CMP, INR, EKG, CXR  - Primary team notified?  Yes  - Family Notified? Yes  - Additional notes/ transfer status: Haldol and lopressor had been given at 1:00AM. A cardiac [place] was performed during the code and a very small (unchanged) pericardial effusion noted with spontaneous heart rhythm.     Drucilla Schmidt, MD  06-25-14, 3:01 AM    CRITICAL CARE: The patient is critically ill with multiple organ systems failure and requires high complexity decision making for assessment and support, frequent evaluation and titration of therapies, application of advanced monitoring technologies and extensive interpretation of multiple databases. Critical Care Time devoted to patient care services described in this note is 55 minutes. STAFF NOTE: I have personally seen and evaluated the patient and reviewed the available data. I have  discussed the patient with the housestaff officer or NP. I agree with the resident's note as documented above.  Briefly: 48yom with months of physical decline recently presented with new Dx myasthenia.  Receiving plasmapharesis for this.  Over the past few days has had issues with Afib with RVR, hypoxia, and resp distress.  Resp issues thought to be 2/2 aspiration PNA.  Earlier this evening, pt was noted to be increasingly restless and hypertensive.  At approx 0245, pt became bradycardic acutely and ultimately had PEA.  CPR was initiated with details as above.  ROSC in about 15 minutes.  Bedside U/s performed by me demonstrated trace pericardial effusion and grossly good EF after ROSC.  CXR with opacification of R lung and increased vascular congrestion.  Given clinical picture, suspect pt had aspiration event with subsequent arrest.  He likely has some degree of pulm edema as well.  Notably, approx 45 min after ROSC, pt was opening eyes to voice and grabbing at ETT.  Hemodynamics had stabilized as well with good BP and HR.    Met with pt's family (daughter, son) and updated them on events.  Recommended that if pt were to arrest again, we not pursue CPR given his frail state, loss of circulation x 15 min and overall comorbidities.  They were in agreement.  Pt is now DNR.    We are treating pt for aspiration PNA and pulm edema with Zosyn and lasix $RemoveB'40mg'kajMvkgF$  IV x 1 respectively.  Blood and sputum cx  ordered.     [name], MD [facility]  Addendum: At approx [phone], pt was noted to have worsening hypoxia.  ABG revealed PaO2 of 42 on 100% Fi02 and PEEP of 10.  I was meeting with the family and they had decided to pursue comfort only measures.  However, pt concurrently went into PEA and expired.  Pt was pronounced at 0444 with family at bedside.

## 2014-06-24 NOTE — Progress Notes (Signed)
Peep increased to 10 per Otis Dialsharles Austin, MD.

## 2014-06-24 NOTE — Progress Notes (Signed)
ANTIBIOTIC CONSULT NOTE - INITIAL  Pharmacy Consult for Unasyn Indication: aspiration pneumonia  Allergies  Allergen Reactions  . Sulfa Antibiotics Itching    Patient Measurements: Height: 5\' 9"  (175.3 cm) Weight: 138 lb 10.7 oz (62.9 kg) IBW/kg (Calculated) : 70.7   Vital Signs: Temp: 98.5 F (36.9 C) (02/28 0400) Temp Source: Axillary (02/28 0400) BP: 163/63 mmHg (02/28 0330) Pulse Rate: 77 (02/28 0330) Intake/Output from previous day: 02/27 0701 - 02/28 0700 In: 2720.6 [I.V.:510.6; NG/GT:1210; IV Piggyback:1000] Out: 500 [Urine:500] Intake/Output from this shift: Total I/O In: 781.9 [I.V.:216.9; NG/GT:565] Out: -   Labs:  Recent Labs  06/03/2014 1225  06/20/14 0600 06/21/14 0645 06/21/14 0858 11-Feb-2015 0300  WBC 7.4  --  10.9*  --   --  16.0*  HGB 12.2*  < > 11.5*  --  11.6* 10.8*  PLT 129*  --  135*  --   --  125*  CREATININE 1.31  < > 1.26 1.40* 1.40* 1.46*  < > = values in this interval not displayed. Estimated Creatinine Clearance: 34.1 mL/min (by C-G formula based on Cr of 1.46). No results for input(s): VANCOTROUGH, VANCOPEAK, VANCORANDOM, GENTTROUGH, GENTPEAK, GENTRANDOM, TOBRATROUGH, TOBRAPEAK, TOBRARND, AMIKACINPEAK, AMIKACINTROU, AMIKACIN in the last 72 hours.   Microbiology: No results found for this or any previous visit (from the past 720 hour(s)).  Medical History: Past Medical History  Diagnosis Date  . Hypertension   . Carotid artery disease   . Weight loss, unintentional   . Protein-calorie malnutrition, moderate     Medications:  Prescriptions prior to admission  Medication Sig Dispense Refill Last Dose  . aspirin 325 MG tablet Take 325 mg by mouth daily.   06/11/2014 at 0800  . benazepril (LOTENSIN) 20 MG tablet Take 20 mg by mouth daily.   06/11/2014 at 0800  . feeding supplement, ENSURE COMPLETE, (ENSURE COMPLETE) LIQD Take 237 mLs by mouth 2 (two) times daily between meals.     . finasteride (PROSCAR) 5 MG tablet Take 5 mg by mouth  daily.   06/11/2014 at 0800  . lactose free nutrition (BOOST PLUS) LIQD Take 237 mLs by mouth 2 (two) times daily with a meal.  0   . mirtazapine (REMERON) 15 MG tablet Take 15 mg by mouth at bedtime.   06/11/2014  . omeprazole (PRILOSEC) 20 MG capsule Take 20 mg by mouth 2 (two) times daily.   06/11/2014 at 0800  . polyethylene glycol (MIRALAX / GLYCOLAX) packet Take 17 g by mouth 2 (two) times daily. 14 each 0   . pyridostigmine (MESTINON) 60 MG tablet Take 1 tablet (60 mg total) by mouth every 8 (eight) hours.      Scheduled:  . ampicillin-sulbactam (UNASYN) IV  3 g Intravenous Q8H  . antiseptic oral rinse  7 mL Mouth Rinse q12n4p  . aspirin EC  325 mg Oral Daily  . benazepril  20 mg Oral Daily  . chlorhexidine  15 mL Mouth Rinse BID  . famotidine  20 mg Per Tube Daily  . fentaNYL      . heparin  1,000 Units Intracatheter Once  . heparin  5,000 Units Subcutaneous 3 times per day  . polyethylene glycol  17 g Oral Daily  . pyridostigmine  90 mg Per Tube TID   Assessment: 81 y.o male with months of physical decline recently presented with new Dx myasthenia. Admitted on 06/05/2014. S/p cardiac arrest early this AM. Pharmacy consulted to dose Unasyn for aspiration pneumonia.  Patient is now DNR. WBC  16K , Tc 98.5, SCr 1.46, estimated CrCl ~ 34 ml/min.    Plan:  Unasyn 3 gm IV q8hr Monitor clinical status, renal function daily.    Noah Delaine, RPh Clinical Pharmacist Pager: [phone] 07/05/2014,4:29 AM

## 2014-06-24 NOTE — Progress Notes (Signed)
Patient combative and trying to pull apart medical equipment. He is also trying to get out of bed. Attempted to reorient patient without success. Dr. Darrick Pennaeterding notified and new orders received.

## 2014-06-24 NOTE — Progress Notes (Signed)
Notified Dr. Darrick Pennaeterding of increased BP and crackles on the right lower lung field with frequent cough. Orders received for BP med. Will continue to monitor and notify MD as needed.

## 2014-06-24 NOTE — Progress Notes (Signed)
Patient belongings including watch and glasses given to son Lloyd Huger(Neil) at [phone]. Patient transported to the morgue at 0621.

## 2014-06-24 NOTE — Progress Notes (Signed)
   Sep 11, 2014 0400  Clinical Encounter Type  Visited With Patient and family together;Health care provider  Visit Type Initial;Spiritual support;Social support;Code;Critical Care  Referral From Nurse  Spiritual Encounters  Spiritual Needs Prayer;Emotional  Stress Factors  Patient Stress Factors Health changes  Family Stress Factors Health changes   Chaplain was paged at 3:12 AM and notified that a code blue was called for the patient earlier this morning and that family was on the way to the hospital. When chaplain arrived family had not yet arrived. Patient's son, daughter, and son-in-law, have since arrived. Chaplain was present while physician consulted with the family regarding what had happened over the past few hours. Patient's family understand that the patient is critically ill and have expressed that he would likely not wish to be put through another full code situation. Patient's son asked for prayer and chaplain prayed with family. Chaplain escorted family to the bedside of the patient. Patient's family had a hard time seeing the patient intubated fully and restless. Patient's sister broke down into tears and has since chosen to leave bedside and stay in the waiting room. Her husband has been very supportive of her during this time. Patient's son has also went back out to the waiting room. Family does anticipate more family arriving throughout the morning. Page On-Call chaplain if family needs further support today. [place] NeighborStrother, Ammie Warrick R, Chaplain  4:39 AM

## 2014-06-24 NOTE — Progress Notes (Signed)
eLink Physician-Brief Progress Note Patient Name: [name] DOB: [birth year] MRN: [id number]   Date of Service  05/29/2014  HPI/Events of Note  Patient is confused, picking at medical devices and attempting to get OOB.  Concern for patient safety  eICU Interventions  Plan: Posey belt ordered for patient safety Haldol 5 mg IV (QTc less than 500) [facility] if [facility] remains confused     Intervention Category Major Interventions: Delirium, psychosis, severe agitation - evaluation and management  DETERDING,ELIZABETH 06/07/2014, 1:20 AM

## 2014-06-24 DEATH — deceased

## 2014-06-27 ENCOUNTER — Telehealth: Payer: Self-pay

## 2014-06-27 NOTE — Telephone Encounter (Signed)
Received mailed death certificate 4/0/983/4/16 from [facility].  Leaving upstairs for Dr. Delton CoombesByrum to sign.  Signed by Dr. Delton CoombesByrum.  [facility]/30/14.

## 2014-07-04 NOTE — Discharge Summary (Signed)
NAMHinda Kehr:  [name], Ronald               ACCOUNT NO.:  [id number]  MEDICAL RECORD NO.:  [id number]  LOCATION:  [id number]                        FACILITY:  [facility]  PHYSICIAN:  Veto KempsUGLAS BRENT [name], MDDATE OF BIRTH:  1928/10/07  DATE OF ADMISSION:  11/28/14 DATE OF DISCHARGE:  05/28/2014                              DISCHARGE SUMMARY   DATE OF DEATH:  June 21, 2014.  CAUSE OF DEATH: 1. Acute cardiogenic shock. 2. Acute respiratory failure with hypoxemia. 3. Likely aspiration pneumonia.  HOSPITAL COURSE:  This is an 81 year old male with a past medical history significant for hypertension, malnutrition, and myasthenia gravis who had been treated for the same and was [facility].  There, he was treated with plasmapheresis.  However, the day after starting plasmapheresis, he developed increasing weakness, atrial fibrillation, and hypoxemic respiratory failure, and he was transferred to the intensive care unit for further management.  It was initially felt that he would not be able to protect his airway so he was treated with high-flow oxygen.  Antibiotics were empirically used for aspiration pneumonia, and he was treated further with plasmapheresis for presumed myasthenia gravis.  He initially improved dramatically as his oxygen needs improved and his strength improved.  He is maintained in the intensive care unit.  However, somewhat abruptly on June 21, 2014, he became more hypoxemic and weak, requiring mechanical ventilation, and he developed a cardiac arrest.  He was treated aggressively in the intensive care unit with vasopressors and chronotropic agents in addition to mechanical ventilation but unfortunately he died early in the morning of June 22, 2014, with his family at the bedside.          ______________________________ Veto KempsUGLAS BRENT [name], MD     DBM/MEDQ  D:  07/03/2014  T:   07/04/2014  Job:  [id number]

## 2014-07-29 NOTE — Discharge Summary (Signed)
NAMHinda [name]:  [name], [name]               ACCOUNT NO.:  [id number]  MEDICAL RECORD NO.:  [id number]  LOCATION:  [id number]                        FACILITY:  [facility]  PHYSICIAN:  [name], M.D.DATE OF BIRTH:  Jan 26, 1929  DATE OF ADMISSION:  06/12/2014 DATE OF DISCHARGE:  06/18/2014                              DISCHARGE SUMMARY   DISCHARGE DIAGNOSES: 1. Myasthenia gravis with respiratory failure, atrial fibrillation. 2. Dysphagia. 3. Hypertension. 4. Benign prostatic hypertrophy.  An 81 year old right-handed male with history of hypertension, independent prior to admission until the spring of 2015, with noted gradual onset of generalized weakness, double vision, weight loss and dysphagia.  He was followed by Neurology Service for workup of myasthenia gravis.  Presented on June 12, 2014, with increasing weakness.  MRI was negative.  Placed on IVIG x5 doses with little significant improvement with ongoing workup per Neurology Services and myasthenia gravis panel was pending.  Plasmapheresis initiated on June 17, 2014 x5 treatments.  He remained on a dysphagia 2 diet with thin liquids.  Subcutaneous Lovenox for DVT prophylaxis.  Physical and occupational therapy ongoing.  The patient was admitted for comprehensive rehab program.  PAST MEDICAL HISTORY:  See discharge diagnoses.  SOCIAL HISTORY:  Lives alone.  FUNCTIONAL HISTORY:  Prior to admission, had been independent until the spring of 2015 and now holding the furniture to ambulate.  FUNCTIONAL STATUS:  Upon admission to Rehab Services, minimal assist to ambulate 180 feet with the rolling walker, minimal guard stand pivot transfers, min-to-mod assist activities of daily living.  PHYSICAL EXAMINATION:  VITAL SIGNS:  Blood pressure 176/93, pulse 64, temperature 98, respirations 19. GENERAL:  This was an alert male.  Speech was dysarthric, but intelligible, mild ptosis and facial diplegia, persistent.  Fair worriness to  his deficits, oriented x3. LUNGS:  Decreased breath sounds.  Clear to auscultation with some upper airway rhonchi. CARDIAC:  Rate was controlled. ABDOMEN:  Soft, nontender.  Good bowel sounds.  REHABILITATION HOSPITAL COURSE:  The patient was admitted to Inpatient Rehab Services.  Therapies initiated on a 3-hour daily basis.  The following issues were addressed during the patient's rehabilitation stay.  Pertaining to [name]'s myasthenia gravis, he had completed IVIG.  Plasmapheresis initiated on June 17, 2014 x5 treatments every other day followed by Neurology Services.  He also continued on Mestinon.  Subcutaneous heparin for DVT prophylaxis.  He was on a dysphagia 2 diet with thin liquids.  Therapies were initiated, with evaluations [facility], [facility], noted elevated heart rate.  EKG revealed atrial fibrillation at 148 beats per minute.  The patient also on some distress with respirations.  Oxygen saturations were still good, greater than 90%.  Cardiology Service was consulted.  The patient continued to be compromise with respirations, difficulty with clearing secretions.  It was felt with these medical changes, he would be discharged to acute care with Critical Care Medicine consulted, question plan for intubation.  He did receive Cardizem infusion prior to his discharge from the Rehab Unit to Critical Care.  [facility] had been contacted, continued to follow.  The patient in guarded condition at time of discharge.  All  medication changes made as per Medicine team.     Mariam Dollaraniel Angiulli, P.A.   ______________________________ [name], M.D.    DA/MEDQ  D:  06/14/2014  T:  06/20/2014  Job:  [id number]

## 2016-02-19 IMAGING — CR DG CHEST 1V PORT
1 series · 1 of 1 positions shown · non-contrast
Comparison: 06/12/2014 and earlier.

CLINICAL DATA: 85-year-old male status post central line placement,
positioning. Initial encounter.

EXAM:
PORTABLE CHEST - 1 VIEW

[portable]
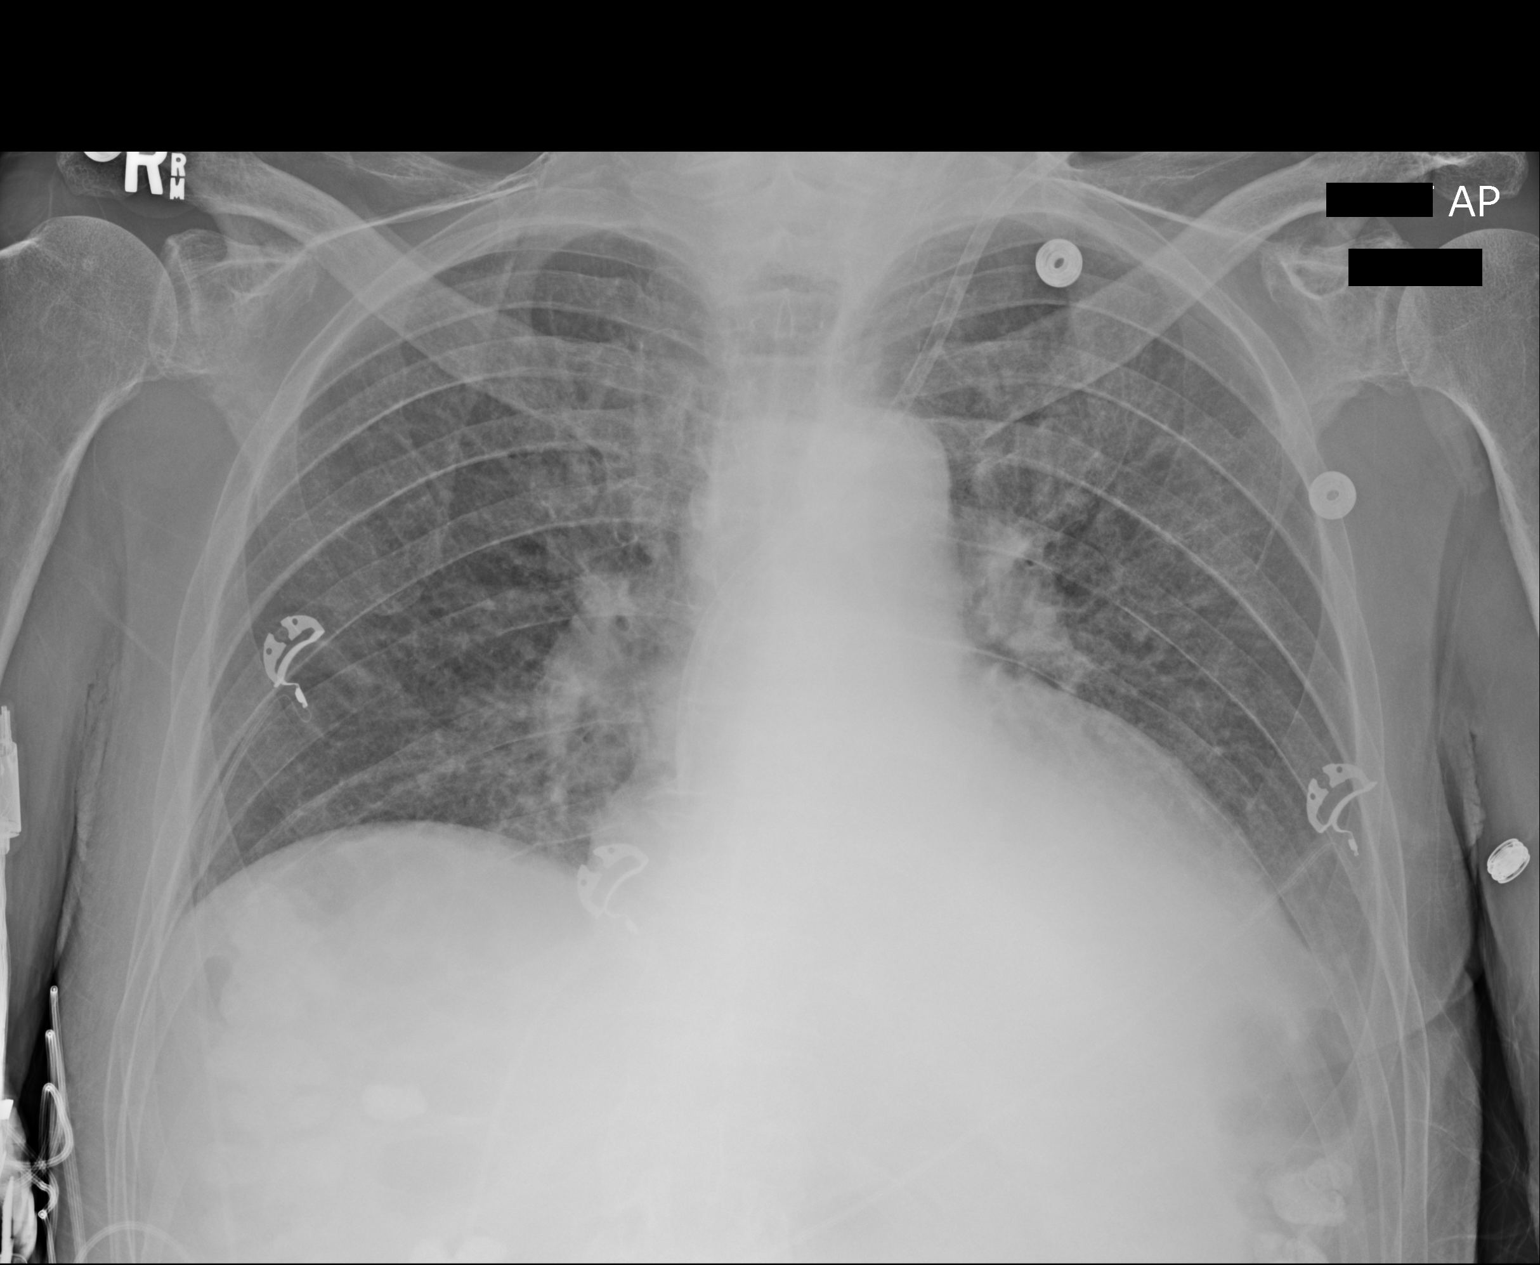

[1 of 1 positions shown; findings below may reference images not displayed]

FINDINGS: Portable AP upright view at 3565 hours. Left IJ approach central
line now in place. Tip projects at the cavoatrial junction level.
Lower lung volumes. Stable cardiac size and mediastinal contours. No
pneumothorax. Increased interstitial opacity, in part felt due to
crowding. Increased retrocardiac opacity, Suggestion of small left
pleural effusion.
IMPRESSION: 1. Left IJ approach central line placed, tip at the cavoatrial
junction level.
2. Lower lung volumes with increased crowding of markings and
possible vascular congestion.
3. Increased lung base opacity suspected due to left pleural
effusion.

## 2016-02-22 IMAGING — CR DG ABD PORTABLE 1V
1 series · 1 of 1 positions shown · non-contrast
Comparison: None.

CLINICAL DATA: Feeding tube placement

EXAM:
PORTABLE ABDOMEN - 1 VIEW

[AP]
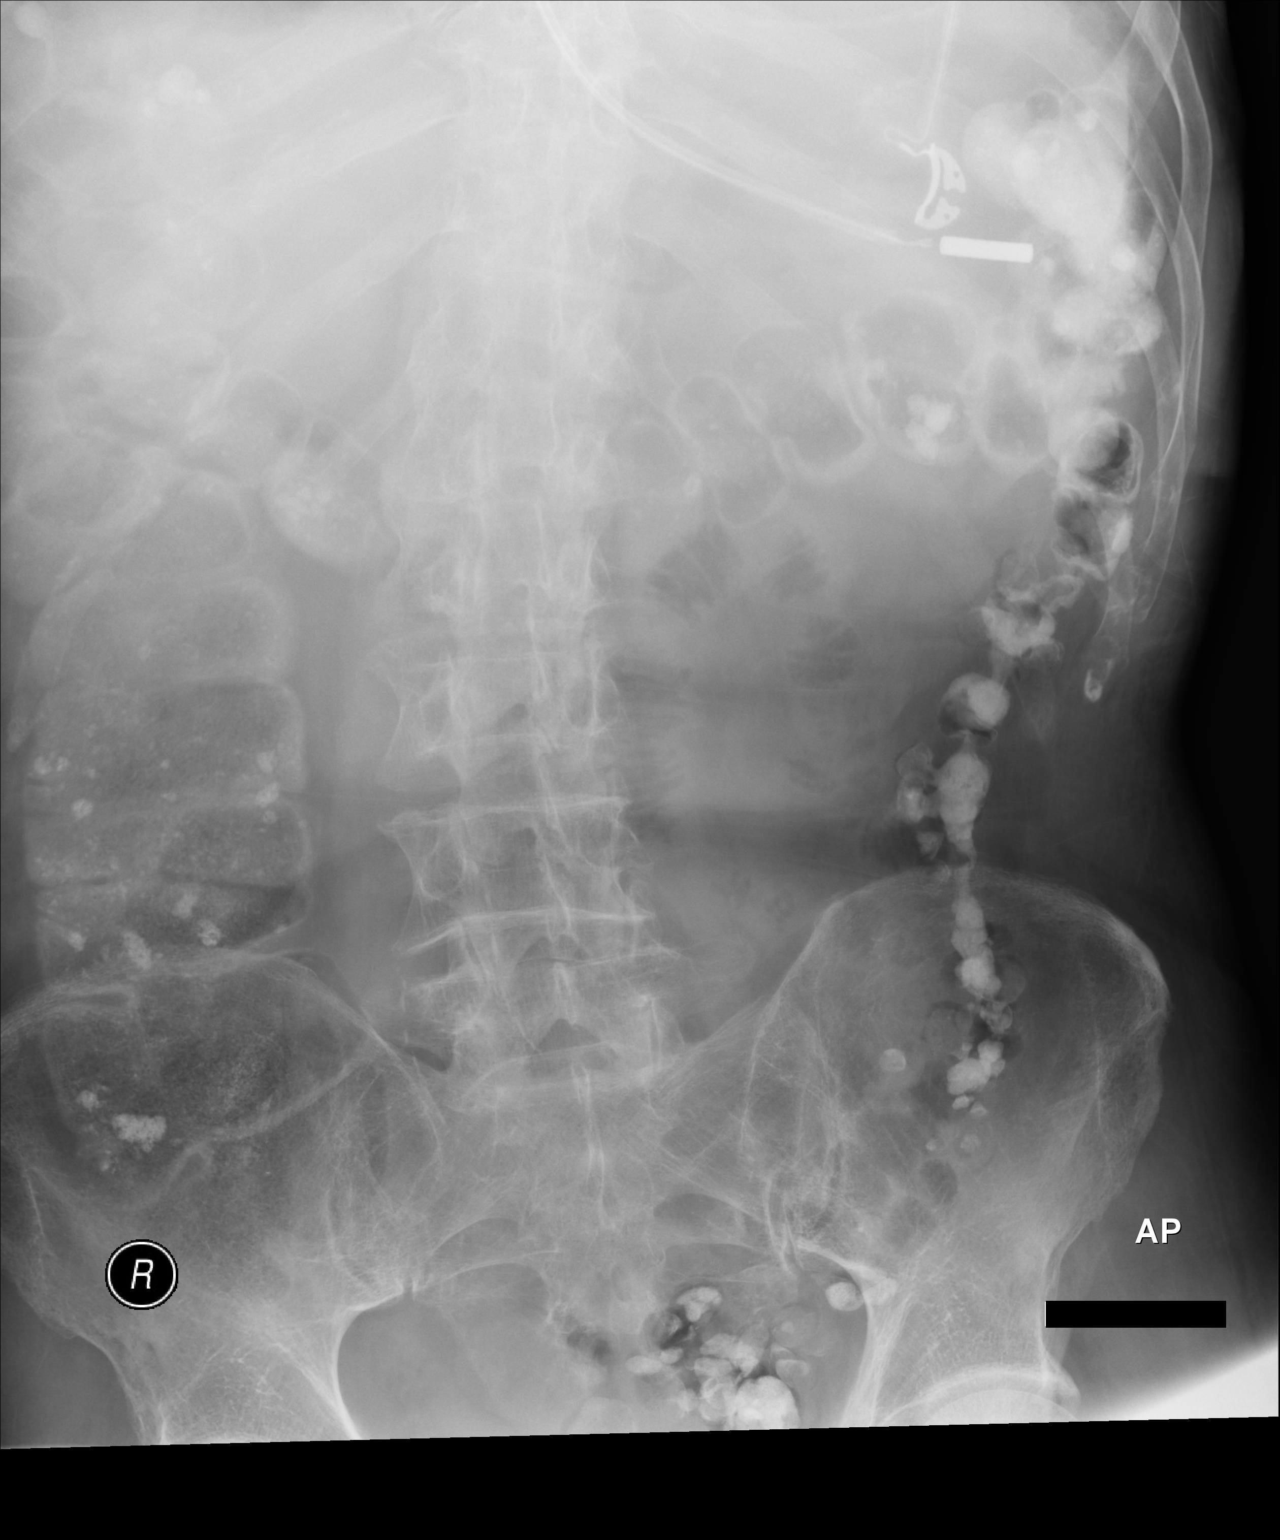

[1 of 1 positions shown; findings below may reference images not displayed]

FINDINGS: The bowel gas pattern is normal. These NG feeding tube with tip in
proximal stomach. Residual contrast material noted within colon.
Distal colonic diverticula are noted.
IMPRESSION: NG feeding tube with tip in proximal stomach.

## 2016-02-23 IMAGING — CR DG CHEST 1V PORT
1 series · 1 of 1 positions shown · non-contrast
Comparison: June 19, 2014

CLINICAL DATA: Respiratory failure

EXAM:
PORTABLE CHEST - 1 VIEW

[AP]
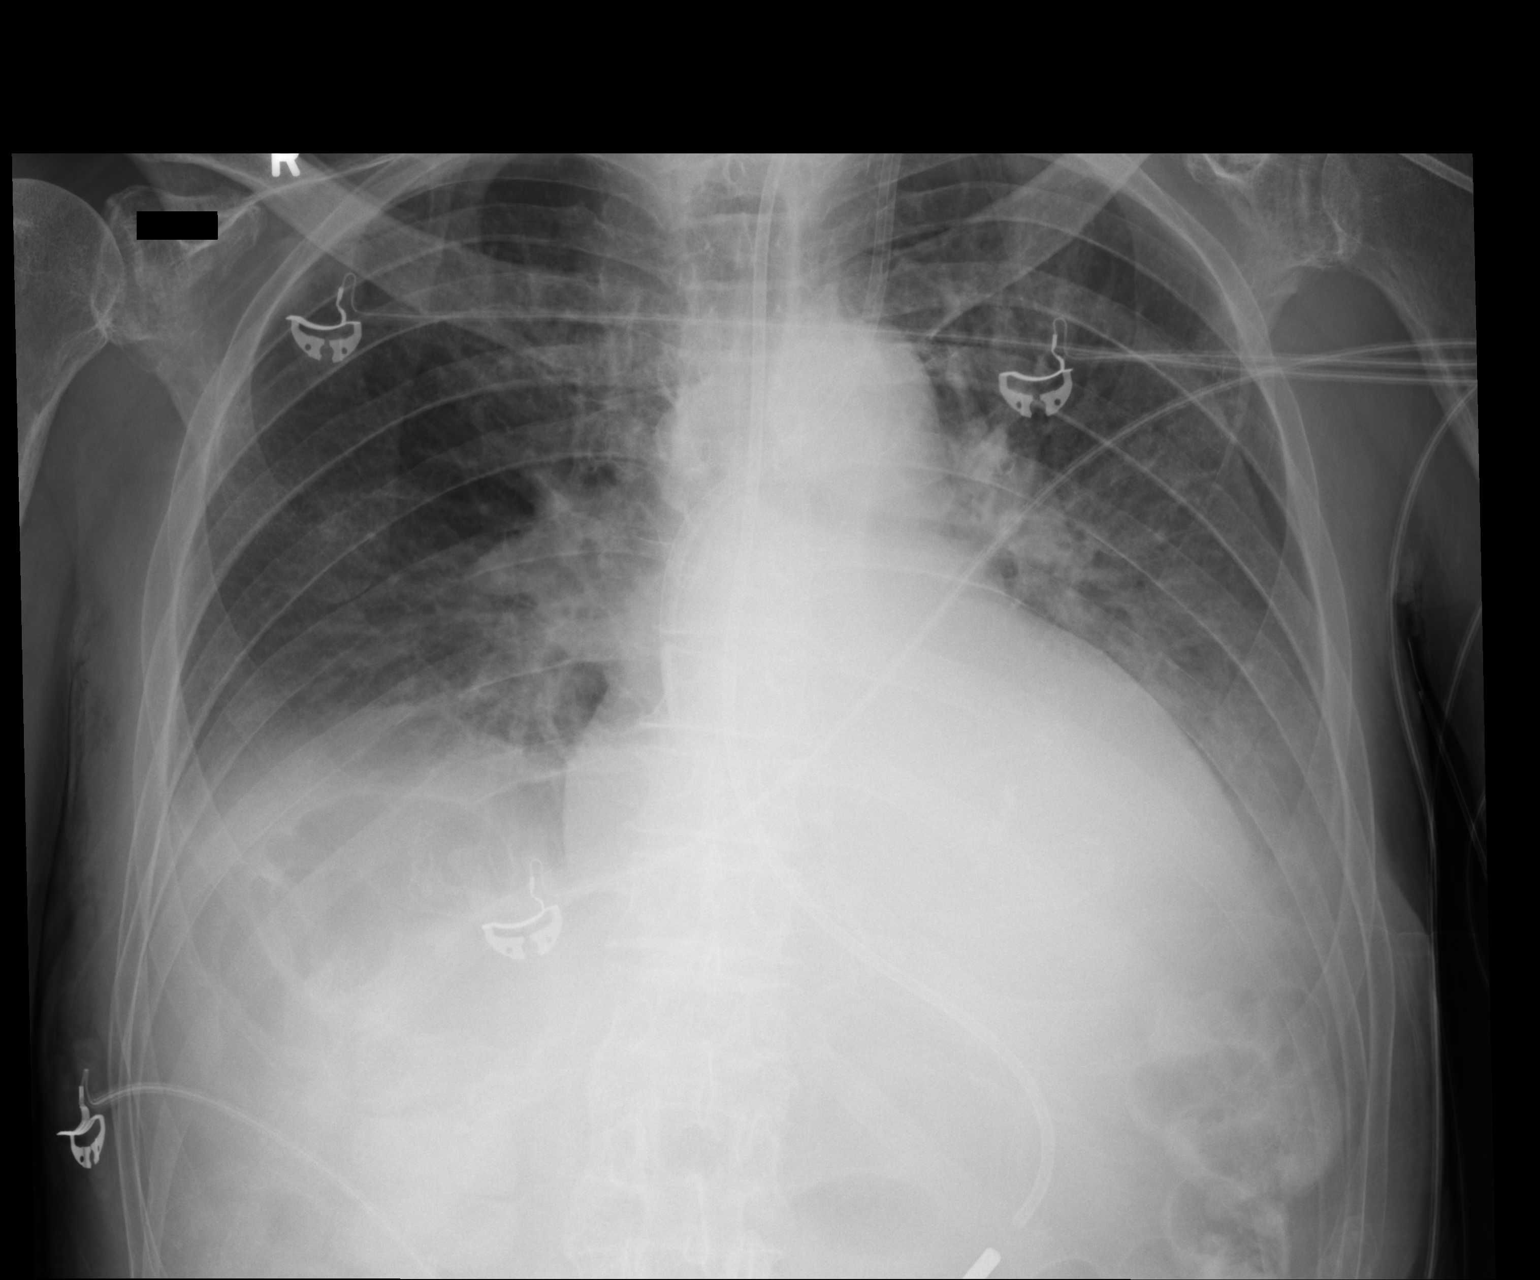

[1 of 1 positions shown; findings below may reference images not displayed]

FINDINGS: Central catheter tip is in the superior vena cava. Feeding tube tip
is in the stomach. No pneumothorax. There is persistent
consolidation in the left lower lobe with clearing left effusion.
There is a small right effusion as well. Lungs elsewhere clear.
Heart is enlarged. There appears to be a degree of pulmonary venous
hypertension. No adenopathy.
IMPRESSION: Tube and catheter positions as described without pneumothorax.
Persistent left lower lobe consolidation. There are bilateral
pleural effusions, larger on the left than on the right. Stable
cardiomegaly with a degree of pulmonary venous hypertension. There
is felt to be a degree of underlying congestive heart failure.

## 2016-02-25 IMAGING — CR DG CHEST 1V PORT
2 series · 2 of 2 positions shown · non-contrast
Comparison: Chest radiograph 06/20/2014

CLINICAL DATA: Endotracheal tube placement.

EXAM:
PORTABLE CHEST - 1 VIEW

[AP (1 of 2)]
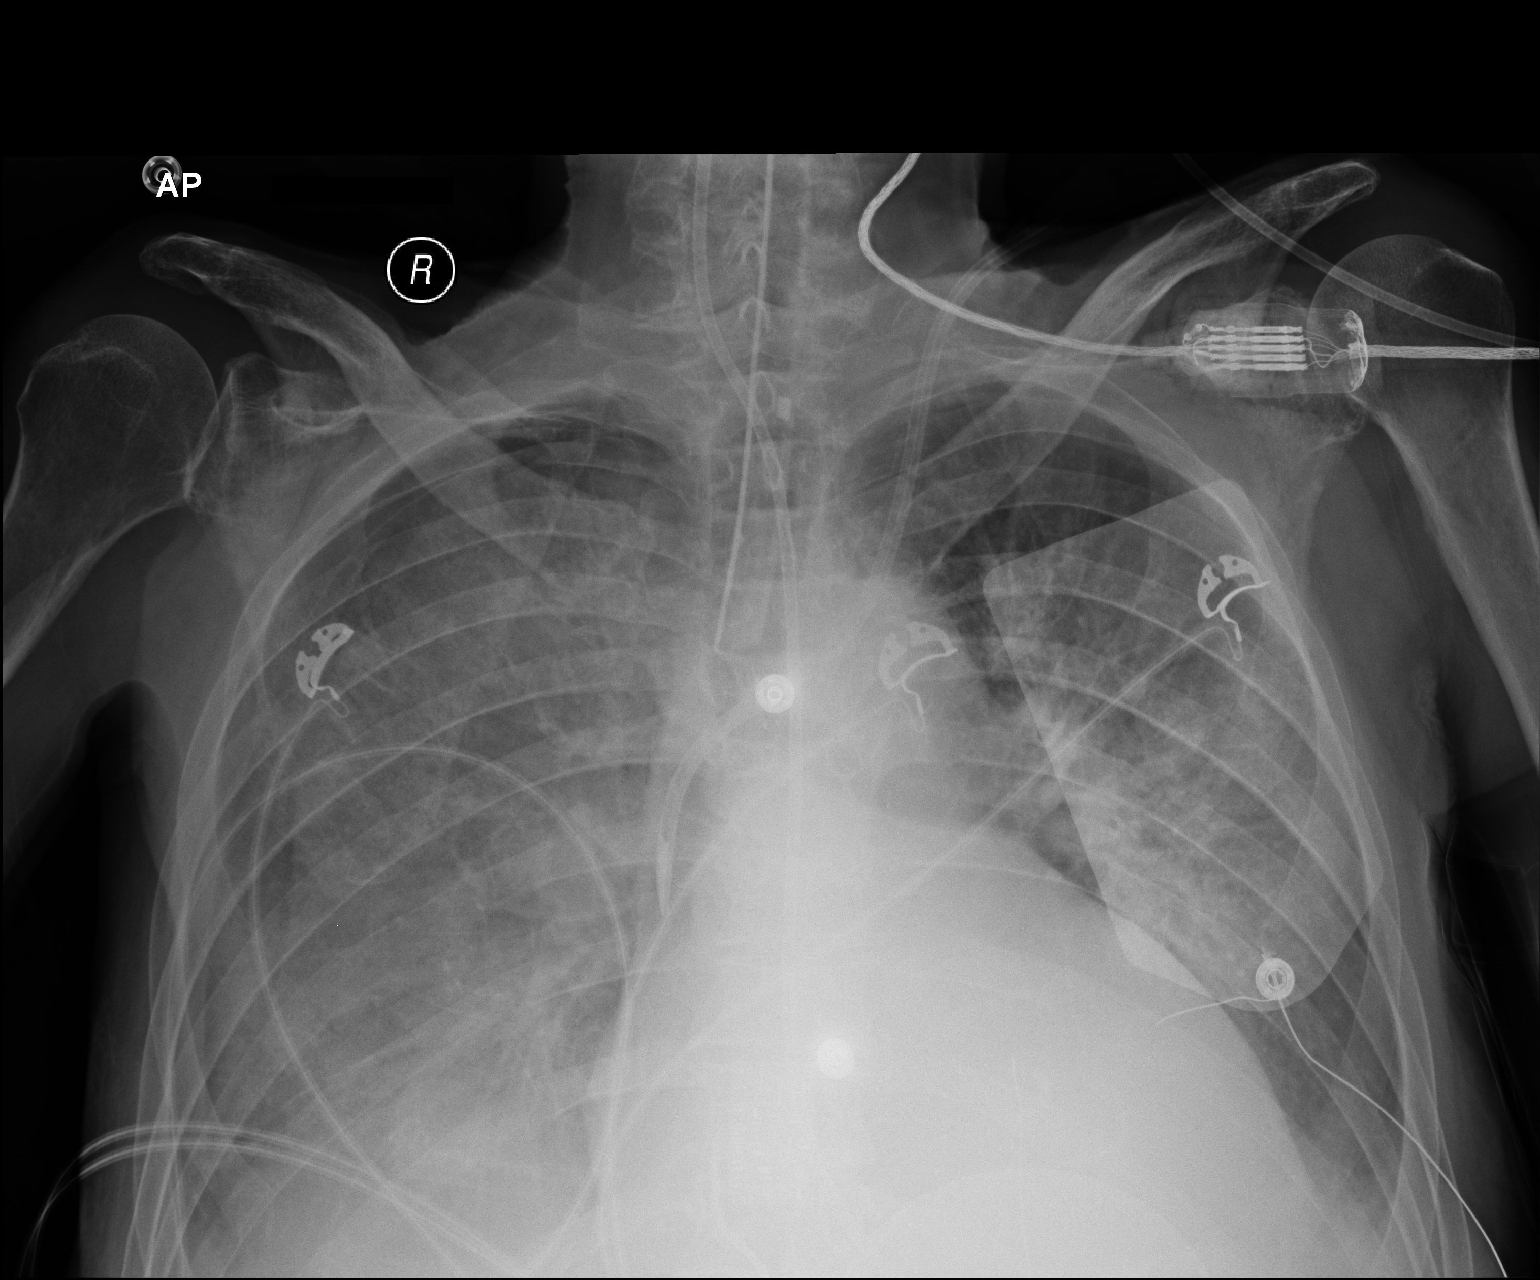

[AP (2 of 2)]
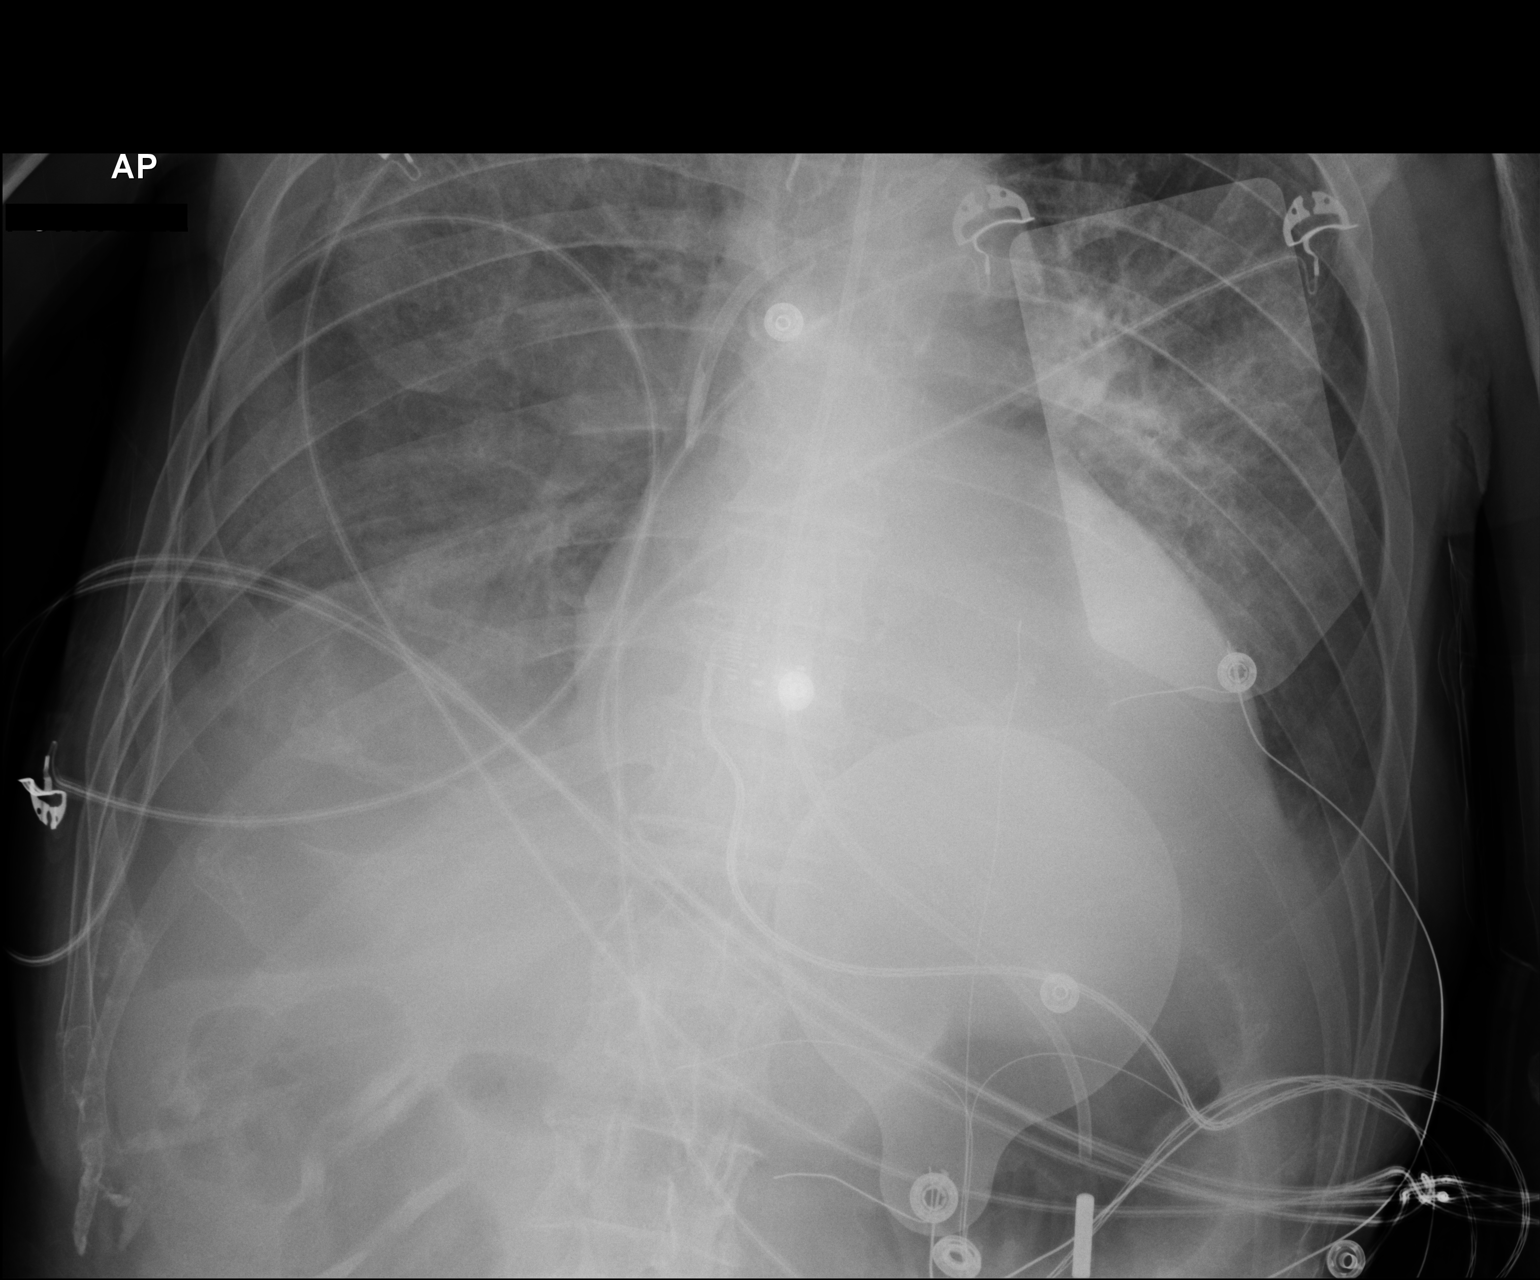

[2 of 2 positions shown; findings below may reference images not displayed]

FINDINGS: Endotracheal tube is 3.7 cm from the carina. An enteric tube is in
place, tip in the stomach. Tip of the left central line in the SVC.
Cardiomegaly is stable. Increased right pleural effusion and hazy
opacities throughout the hemithorax. Left pleural effusion is
unchanged. Progressive vascular congestion.
IMPRESSION: 1. Endotracheal tube 3.7 cm from the carina.
2. Worsening right lung aeration with increasing pleural effusion
and hazy opacities throughout the hemithorax. Worsening vascular
congestion.
3. Unchanged left lower lobe consolidation and left pleural
effusion.
